# Patient Record
Sex: Female | Born: 1965 | Race: Black or African American | Hispanic: No | Marital: Single | Smoking: Never smoker
Health system: Southern US, Community
[De-identification: ages and names within clinical notes are randomized; demographics above are authoritative.]

## PROBLEM LIST (undated history)

## (undated) DIAGNOSIS — K219 Gastro-esophageal reflux disease without esophagitis: Secondary | ICD-10-CM

## (undated) DIAGNOSIS — L821 Other seborrheic keratosis: Secondary | ICD-10-CM

## (undated) DIAGNOSIS — D259 Leiomyoma of uterus, unspecified: Secondary | ICD-10-CM

## (undated) DIAGNOSIS — J321 Chronic frontal sinusitis: Secondary | ICD-10-CM

## (undated) DIAGNOSIS — G43009 Migraine without aura, not intractable, without status migrainosus: Secondary | ICD-10-CM

## (undated) DIAGNOSIS — M659 Synovitis and tenosynovitis, unspecified: Secondary | ICD-10-CM

## (undated) DIAGNOSIS — Z800 Family history of malignant neoplasm of digestive organs: Secondary | ICD-10-CM

## (undated) DIAGNOSIS — J209 Acute bronchitis, unspecified: Secondary | ICD-10-CM

## (undated) DIAGNOSIS — R42 Dizziness and giddiness: Secondary | ICD-10-CM

## (undated) DIAGNOSIS — G44229 Chronic tension-type headache, not intractable: Secondary | ICD-10-CM

## (undated) DIAGNOSIS — G4700 Insomnia, unspecified: Secondary | ICD-10-CM

## (undated) DIAGNOSIS — K5900 Constipation, unspecified: Secondary | ICD-10-CM

## (undated) DIAGNOSIS — I10 Essential (primary) hypertension: Secondary | ICD-10-CM

## (undated) DIAGNOSIS — M79673 Pain in unspecified foot: Principal | ICD-10-CM

## (undated) DIAGNOSIS — N952 Postmenopausal atrophic vaginitis: Secondary | ICD-10-CM

## (undated) DIAGNOSIS — Z1211 Encounter for screening for malignant neoplasm of colon: Secondary | ICD-10-CM

## (undated) HISTORY — DX: Leiomyoma of uterus, unspecified: D25.9

## (undated) HISTORY — DX: Migraine without aura, not intractable, without status migrainosus: G43.009

## (undated) HISTORY — DX: Chronic frontal sinusitis: J32.1

## (undated) HISTORY — DX: Dizziness and giddiness: R42

## (undated) HISTORY — DX: Hypercalcemia: E83.52

## (undated) HISTORY — DX: Acute bronchitis, unspecified: J20.9

## (undated) HISTORY — DX: Encounter for screening for malignant neoplasm of colon: Z12.11

## (undated) HISTORY — DX: Pain in unspecified foot: M79.673

## (undated) HISTORY — DX: Chronic tension-type headache, not intractable: G44.229

## (undated) HISTORY — DX: Insomnia, unspecified: G47.00

## (undated) HISTORY — DX: Other seborrheic keratosis: L82.1

## (undated) HISTORY — DX: Constipation, unspecified: K59.00

## (undated) HISTORY — PX: HEMORROIDECTOMY: SUR656

## (undated) HISTORY — DX: Family history of malignant neoplasm of digestive organs: Z80.0

## (undated) HISTORY — DX: Gastro-esophageal reflux disease without esophagitis: K21.9

## (undated) HISTORY — DX: Synovitis and tenosynovitis, unspecified: M65.9

## (undated) HISTORY — DX: Postmenopausal atrophic vaginitis: N95.2

---

## 1998-04-07 ENCOUNTER — Emergency Department (HOSPITAL_COMMUNITY): Admission: EM | Admit: 1998-04-07 | Discharge: 1998-04-07 | Payer: Self-pay | Admitting: Emergency Medicine

## 2006-09-11 DIAGNOSIS — J209 Acute bronchitis, unspecified: Secondary | ICD-10-CM

## 2006-09-11 DIAGNOSIS — J321 Chronic frontal sinusitis: Secondary | ICD-10-CM | POA: Insufficient documentation

## 2006-09-11 DIAGNOSIS — G43909 Migraine, unspecified, not intractable, without status migrainosus: Secondary | ICD-10-CM | POA: Insufficient documentation

## 2006-09-11 HISTORY — DX: Chronic frontal sinusitis: J32.1

## 2006-09-11 HISTORY — DX: Acute bronchitis, unspecified: J20.9

## 2007-05-01 DIAGNOSIS — K649 Unspecified hemorrhoids: Secondary | ICD-10-CM | POA: Insufficient documentation

## 2007-05-15 DIAGNOSIS — L821 Other seborrheic keratosis: Secondary | ICD-10-CM | POA: Insufficient documentation

## 2007-05-15 HISTORY — DX: Other seborrheic keratosis: L82.1

## 2008-06-17 DIAGNOSIS — K625 Hemorrhage of anus and rectum: Secondary | ICD-10-CM | POA: Insufficient documentation

## 2008-06-17 DIAGNOSIS — Z1211 Encounter for screening for malignant neoplasm of colon: Secondary | ICD-10-CM | POA: Insufficient documentation

## 2008-06-17 DIAGNOSIS — R109 Unspecified abdominal pain: Secondary | ICD-10-CM | POA: Insufficient documentation

## 2008-06-17 DIAGNOSIS — Z800 Family history of malignant neoplasm of digestive organs: Secondary | ICD-10-CM

## 2008-06-17 HISTORY — DX: Family history of malignant neoplasm of digestive organs: Z80.0

## 2008-06-17 HISTORY — DX: Encounter for screening for malignant neoplasm of colon: Z12.11

## 2010-01-04 ENCOUNTER — Emergency Department (HOSPITAL_COMMUNITY): Admission: EM | Admit: 2010-01-04 | Discharge: 2010-01-04 | Payer: Self-pay | Admitting: Emergency Medicine

## 2010-01-11 ENCOUNTER — Ambulatory Visit (HOSPITAL_COMMUNITY): Admission: RE | Admit: 2010-01-11 | Discharge: 2010-01-11 | Payer: Self-pay | Admitting: General Surgery

## 2010-09-03 HISTORY — PX: TOTAL ABDOMINAL HYSTERECTOMY W/ BILATERAL SALPINGOOPHORECTOMY: SHX83

## 2010-10-10 ENCOUNTER — Other Ambulatory Visit: Payer: Self-pay | Admitting: Obstetrics & Gynecology

## 2010-10-10 DIAGNOSIS — Z1231 Encounter for screening mammogram for malignant neoplasm of breast: Secondary | ICD-10-CM

## 2010-10-23 ENCOUNTER — Ambulatory Visit: Payer: Self-pay

## 2010-11-02 ENCOUNTER — Ambulatory Visit
Admission: RE | Admit: 2010-11-02 | Discharge: 2010-11-02 | Disposition: A | Discharge status: Home / Self Care | Payer: BC Managed Care – PPO | Source: Ambulatory Visit | Attending: Obstetrics & Gynecology | Admitting: Obstetrics & Gynecology

## 2010-11-02 DIAGNOSIS — Z1231 Encounter for screening mammogram for malignant neoplasm of breast: Secondary | ICD-10-CM

## 2010-11-06 ENCOUNTER — Ambulatory Visit: Payer: Self-pay

## 2010-11-28 ENCOUNTER — Other Ambulatory Visit: Payer: Self-pay | Admitting: Obstetrics & Gynecology

## 2010-11-28 DIAGNOSIS — M545 Low back pain, unspecified: Secondary | ICD-10-CM

## 2010-12-05 ENCOUNTER — Other Ambulatory Visit: Payer: BC Managed Care – PPO

## 2011-03-05 ENCOUNTER — Ambulatory Visit (HOSPITAL_COMMUNITY)
Admission: RE | Admit: 2011-03-05 | Discharge: 2011-03-05 | Disposition: A | Discharge status: Home / Self Care | Payer: BC Managed Care – PPO | Source: Ambulatory Visit | Attending: Obstetrics & Gynecology | Admitting: Obstetrics & Gynecology

## 2011-03-05 ENCOUNTER — Other Ambulatory Visit: Payer: Self-pay | Admitting: Obstetrics & Gynecology

## 2011-03-05 DIAGNOSIS — N803 Endometriosis of pelvic peritoneum, unspecified: Secondary | ICD-10-CM | POA: Insufficient documentation

## 2011-03-05 DIAGNOSIS — IMO0002 Reserved for concepts with insufficient information to code with codable children: Secondary | ICD-10-CM | POA: Insufficient documentation

## 2011-03-05 DIAGNOSIS — N946 Dysmenorrhea, unspecified: Secondary | ICD-10-CM | POA: Insufficient documentation

## 2011-03-05 DIAGNOSIS — K7689 Other specified diseases of liver: Secondary | ICD-10-CM | POA: Insufficient documentation

## 2011-03-05 LAB — CBC
MCV: 91 fL (ref 78.0–100.0)
Platelets: 165 10*3/uL (ref 150–400)
RDW: 13.4 % (ref 11.5–15.5)
WBC: 4.7 10*3/uL (ref 4.0–10.5)

## 2011-03-05 LAB — SURGICAL PCR SCREEN
MRSA, PCR: NEGATIVE
Staphylococcus aureus: NEGATIVE

## 2011-03-09 NOTE — Op Note (Signed)
Linda, Nelson               ACCOUNT NO.:  [id number]  MEDICAL RECORD NO.:  [id number]  LOCATION:  9319                          FACILITY:  [facility]  PHYSICIAN:  Genia Del, M.D.DATE OF BIRTH:  1965/11/22  DATE OF PROCEDURE:  03/05/2011 DATE OF DISCHARGE:  03/05/2011                              OPERATIVE REPORT   PREOPERATIVE DIAGNOSES:  Dysmenorrhea, dyspareunia, and uterine myoma.  POSTOPERATIVE DIAGNOSES:  Dysmenorrhea, dyspareunia, and uterine myoma plus mild pelvic endometriosis and possible fatty liver.  PROCEDURE:  Total laparoscopic hysterectomy plus bilateral salpingo- oophorectomy assisted with da Vinci robot and cauterization of endometriosis.  SURGEON:  Genia Del, MD.  ASSISTANTDarryl Nestle, MD.  DESCRIPTION OF PROCEDURE:  Under general anesthesia with endotracheal intubation, the patient is in the lithotomy position.  She is prepped with Betadine on the abdominal, suprapubic, vulvar, and vaginal areas and draped as usual.  The Foley is put in place in the bladder.  We did a vaginal exam revealing an anteverted uterus, normal volume, mobile, no adnexal mass.  We inserted the weighted speculum in the vagina, grasped the anterior lip of the cervix with a tenaculum.  The hysterometer is at 9 cm, a #8 coring is requested.  We dilated the cervix with Hegar dilators up to #25 without difficulty.  The RUMI with a medium coring is put in place, the other instruments are removed.  We go to the abdomen, make a suprapubic incision with the scalpel over 1.5 cm after infiltrating with Marcaine one-quarter plain.  We opened the aponeurosis under direct vision with Mayo scissors.  We do a pursestring stitch with Vicryl zero in the aponeurosis.  The parietal peritoneum was opened bluntly with a finger.  We inserted the Hasson at that level and created pneumoperitoneum with CO2.  We inspected the abdominopelvic cavities. The uterus is slightly irregular with  a small fundal myoma.  The ovaries are normal in size.  The left ovary has superficial lesions of endometriosis.  There are also lesions of endometriosis close to the left uterosacral ligament.  We also note a fatty liver appearance. Pictures were taken of the liver and also of the pelvic organs.  We then removed the camera.  We marked the skin with a surgical pen and used an M configuration for the port placement.  We will put two robotic ports on the right and one assistant port on the upper left and the third robotic port on the lower left.  The ports are all inserted under direct vision after infiltrating with Marcaine one-quarter plain and making incisions with a scalpel.  We then put the patient in deep Trendelenburg.  We dock the robot without difficulty on the right side. We then inserted the instruments, the EndoShear scissor in the first arm, the PK in the second arm, and the Cobra in the third arm.  We go to the console.  We visualized the ureters which are in normal anatomic position, although on the left side it is more difficult to see because of some CO2 under the peritoneum.  We cauterized and sectioned the left round ligament, the left infundibulopelvic ligament, and approached close the uterus, and stop just before  the left uterine artery.  We opened the visceral peritoneum anteriorly and start descending the bladder.  The patient had two previous C-sections, but the adhesions are not severe at that level.  We did exactly the same on the right side and descend the bladder further past the coring.  We then cauterize and sectioned the left uterine artery and the right uterine artery.  We then opened the colpotomy with the tip of the EndoShear scissors, starting anteriorly, then posteriorly, and finishing on each side.  The uterus is completely detached with both ovaries and tubes.  It is removed vaginally and sent to pathology.  We then put the occluder in the vagina to  preserve the pneumoperitoneum.  We cauterized the superficial lesions of endometriosis on the left uterosacral ligament.  We then verify hemostasis on the vaginal vault and completed with the PK.  We switched instruments to the cutting needle driver on the second arm, the regular needle driver on the first arm, and the peak in the third arm.  We then inserted five sutures of Vicryl 0 through the camera port and then parked them on the right lateral abdominal wall.  We closed the vaginal vault with figure-of-eights.  We start on the left angle, then the right angle, and three additional figure-of-eights to finish closing in the middle, that closes the vaginal vault very well, on each bite, the vaginal mucosa was included.  Hemostasis was adequate at all levels. Irrigation and suction was done.  We therefore removed all instruments. We undocked the robot.  We then went by laparoscopy and removed the five needles using a 5-mm camera.  We then irrigated and suctioned once more, removed the patient from deep Trendelenburg to remove as much fluid as possible.  Hemostasis was adequate.  The estimated blood loss was 75 mL. We removed all ports under direct vision, evacuated the CO2.  We then closed all incisions with a Vicryl 4-0 subcuticular stitch.  We attached the pursestring stitch at the supraumbilical incision and we added Dermabond on all incisions.  The occluder was removed from the vagina. No complications occurred.  The count of instruments and sponges was complete.  The patient was brought to recovery room in good stable status.     Genia Del, M.D.     ML/MEDQ  D:  03/05/2011  T:  03/06/2011  Job:  [id number]  Electronically Signed by Genia Del M.D. on 03/09/2011 09:32:36 PM

## 2011-03-14 LAB — TYPE AND SCREEN: ABO/RH(D): B POS

## 2011-03-28 ENCOUNTER — Ambulatory Visit: Payer: BC Managed Care – PPO | Admitting: Gastroenterology

## 2011-03-28 ENCOUNTER — Encounter (HOSPITAL_COMMUNITY): Payer: Self-pay | Admitting: Anesthesiology

## 2011-03-28 ENCOUNTER — Inpatient Hospital Stay (HOSPITAL_COMMUNITY): Payer: BC Managed Care – PPO | Admitting: Anesthesiology

## 2011-03-28 ENCOUNTER — Ambulatory Visit: Admit: 2011-03-28 | Payer: Self-pay | Admitting: Obstetrics & Gynecology

## 2011-03-28 ENCOUNTER — Observation Stay (HOSPITAL_COMMUNITY)
Admission: AD | Admit: 2011-03-28 | Discharge: 2011-03-29 | Disposition: A | Discharge status: Home / Self Care | Payer: BC Managed Care – PPO | Source: Ambulatory Visit | Attending: Obstetrics & Gynecology | Admitting: Obstetrics & Gynecology

## 2011-03-28 ENCOUNTER — Encounter (HOSPITAL_COMMUNITY)
Admission: AD | Disposition: A | Discharge status: Home / Self Care | Payer: Self-pay | Source: Ambulatory Visit | Attending: Obstetrics & Gynecology

## 2011-03-28 DIAGNOSIS — Y838 Other surgical procedures as the cause of abnormal reaction of the patient, or of later complication, without mention of misadventure at the time of the procedure: Secondary | ICD-10-CM | POA: Insufficient documentation

## 2011-03-28 DIAGNOSIS — IMO0002 Reserved for concepts with insufficient information to code with codable children: Principal | ICD-10-CM | POA: Insufficient documentation

## 2011-03-28 HISTORY — PX: VAGINAL PROLAPSE REPAIR: SHX830

## 2011-03-28 LAB — TYPE AND SCREEN

## 2011-03-28 SURGERY — SUSPENSION, VAGINAL VAULT
Anesthesia: General | Wound class: Clean Contaminated

## 2011-03-28 MED ORDER — ONDANSETRON HCL 4 MG/2ML IJ SOLN
INTRAMUSCULAR | Status: AC
  Administered 2011-03-28: 4 mg via INTRAVENOUS
  Filled 2011-03-28: qty 2

## 2011-03-28 MED ORDER — FAMOTIDINE IN NACL 20-0.9 MG/50ML-% IV SOLN
20.0000 mg | Freq: Once | INTRAVENOUS | Status: AC
  Administered 2011-03-28: 20 mg via INTRAVENOUS
  Filled 2011-03-28: qty 50

## 2011-03-28 MED ORDER — NALBUPHINE SYRINGE 5 MG/0.5 ML
5.0000 mg | INJECTION | INTRAMUSCULAR | Status: DC | PRN
  Administered 2011-03-28 – 2011-03-29 (×3): 5 mg via SUBCUTANEOUS
  Filled 2011-03-28: qty 1

## 2011-03-28 MED ORDER — ONDANSETRON HCL 4 MG/2ML IJ SOLN
INTRAMUSCULAR | Status: DC | PRN
  Administered 2011-03-28: 4 mg via INTRAVENOUS

## 2011-03-28 MED ORDER — DEXAMETHASONE SODIUM PHOSPHATE 10 MG/ML IJ SOLN
INTRAMUSCULAR | Status: AC
  Filled 2011-03-28: qty 1

## 2011-03-28 MED ORDER — IBUPROFEN 600 MG PO TABS: 600.0000 mg | ORAL_TABLET | Freq: Four times a day (QID) | ORAL | Status: DC | PRN

## 2011-03-28 MED ORDER — DEXAMETHASONE SODIUM PHOSPHATE 10 MG/ML IJ SOLN
INTRAMUSCULAR | Status: DC | PRN
  Administered 2011-03-28: 10 mg via INTRAVENOUS

## 2011-03-28 MED ORDER — ACETAMINOPHEN 325 MG PO TABS: 325.0000 mg | ORAL_TABLET | ORAL | Status: DC | PRN

## 2011-03-28 MED ORDER — FENTANYL CITRATE 0.05 MG/ML IJ SOLN
INTRAMUSCULAR | Status: DC | PRN
  Administered 2011-03-28 (×2): 50 ug via INTRAVENOUS

## 2011-03-28 MED ORDER — MORPHINE SULFATE 0.5 MG/ML IJ SOLN
INTRAMUSCULAR | Status: AC
  Filled 2011-03-28: qty 10

## 2011-03-28 MED ORDER — OXYCODONE-ACETAMINOPHEN 5-325 MG PO TABS: 1.0000 | ORAL_TABLET | ORAL | Status: DC | PRN

## 2011-03-28 MED ORDER — FENTANYL CITRATE 0.05 MG/ML IJ SOLN
INTRAMUSCULAR | Status: AC
  Filled 2011-03-28: qty 2

## 2011-03-28 MED ORDER — FENTANYL CITRATE 0.05 MG/ML IJ SOLN: 25.0000 ug | INTRAMUSCULAR | Status: DC | PRN

## 2011-03-28 MED ORDER — NALOXONE HCL 0.4 MG/ML IJ SOLN: 0.4000 mg | INTRAMUSCULAR | Status: DC | PRN

## 2011-03-28 MED ORDER — KETOROLAC TROMETHAMINE 30 MG/ML IJ SOLN
INTRAMUSCULAR | Status: AC
  Administered 2011-03-28: 30 mg via INTRAVENOUS
  Filled 2011-03-28: qty 1

## 2011-03-28 MED ORDER — MIDAZOLAM HCL 2 MG/2ML IJ SOLN
INTRAMUSCULAR | Status: AC
  Filled 2011-03-28: qty 2

## 2011-03-28 MED ORDER — SODIUM CHLORIDE 0.9 % IJ SOLN: 3.0000 mL | INTRAMUSCULAR | Status: DC | PRN

## 2011-03-28 MED ORDER — KETOROLAC TROMETHAMINE 30 MG/ML IJ SOLN
30.0000 mg | Freq: Once | INTRAMUSCULAR | Status: AC
  Administered 2011-03-28: 30 mg via INTRAVENOUS

## 2011-03-28 MED ORDER — NALBUPHINE SYRINGE 5 MG/0.5 ML
5.0000 mg | INJECTION | INTRAMUSCULAR | Status: DC | PRN
  Filled 2011-03-28: qty 1

## 2011-03-28 MED ORDER — SODIUM CHLORIDE 0.9 % IV SOLN: 1.0000 ug/kg/h | INTRAVENOUS | Status: DC | PRN

## 2011-03-28 MED ORDER — HYDROMORPHONE HCL 1 MG/ML IJ SOLN: 0.2000 mg | INTRAMUSCULAR | Status: DC | PRN

## 2011-03-28 MED ORDER — MIDAZOLAM HCL 5 MG/5ML IJ SOLN
INTRAMUSCULAR | Status: DC | PRN
  Administered 2011-03-28: 2 mg via INTRAVENOUS

## 2011-03-28 MED ORDER — ONDANSETRON HCL 4 MG/2ML IJ SOLN
4.0000 mg | Freq: Once | INTRAMUSCULAR | Status: AC | PRN
  Administered 2011-03-28: 4 mg via INTRAVENOUS

## 2011-03-28 MED ORDER — CEFAZOLIN SODIUM 1-5 GM-% IV SOLN
INTRAVENOUS | Status: DC | PRN
  Administered 2011-03-28: 1 g via INTRAVENOUS

## 2011-03-28 MED ORDER — KETOROLAC TROMETHAMINE 30 MG/ML IJ SOLN
30.0000 mg | Freq: Four times a day (QID) | INTRAMUSCULAR | Status: DC | PRN
  Administered 2011-03-29: 30 mg via INTRAVENOUS
  Filled 2011-03-28: qty 1

## 2011-03-28 MED ORDER — MEPERIDINE HCL 25 MG/ML IJ SOLN: 6.2500 mg | INTRAMUSCULAR | Status: DC | PRN

## 2011-03-28 MED ORDER — CEFAZOLIN SODIUM 1-5 GM-% IV SOLN
INTRAVENOUS | Status: AC
  Filled 2011-03-28: qty 50

## 2011-03-28 MED ORDER — KETOROLAC TROMETHAMINE 60 MG/2ML IM SOLN: 60.0000 mg | Freq: Once | INTRAMUSCULAR | Status: AC | PRN

## 2011-03-28 MED ORDER — KETOROLAC TROMETHAMINE 30 MG/ML IJ SOLN: 30.0000 mg | Freq: Four times a day (QID) | INTRAMUSCULAR | Status: DC | PRN

## 2011-03-28 MED ORDER — ONDANSETRON HCL 4 MG/2ML IJ SOLN
INTRAMUSCULAR | Status: AC
  Filled 2011-03-28: qty 2

## 2011-03-28 MED ORDER — LACTATED RINGERS IV SOLN
INTRAVENOUS | Status: DC | PRN
  Administered 2011-03-28 (×2): via INTRAVENOUS

## 2011-03-28 MED ORDER — LACTATED RINGERS IV SOLN
INTRAVENOUS | Status: DC
  Administered 2011-03-28: 1000 mL via INTRAVENOUS
  Administered 2011-03-29: 05:00:00 via INTRAVENOUS

## 2011-03-28 NOTE — Progress Notes (Signed)
Pt sent from the office for AOS to repair vagina cuff. Had a hysterectomy on 7-2. Started bleeding heavy today at 1200 with clots. Has has some bleeding before today

## 2011-03-28 NOTE — Transfer of Care (Signed)
Immediate Anesthesia Transfer of Care Note  Patient: Linda Nelson  Procedure(s) Performed:  VAGINAL VAULT SUSPENSION - Repair of Vaginal Cuff  Patient Location: PACU  Anesthesia Type: Spinal  Level of Consciousness: awake, alert  and oriented  Airway & Oxygen Therapy: Patient Spontanous Breathing  Post-op Assessment: Report given to PACU RN  Post vital signs: stable  Complications: No apparent anesthesia complications

## 2011-03-28 NOTE — H&P (Signed)
History  ELGA SANTY 45 y.o.      Chief Complaint  Patient presents with  . Vaginal Bleeding       Postop TLH robotic about 2 wks ago.  Past Medical History  Diagnosis Date  . Uterine fibroid     PSH:  TLH robotic (fatty liver? Op finding, pending GI consult)            Hemorrhoidectomy  Family History  Problem Relation Age of Onset  . Hypertension    . Prostate cancer    . Uterine cancer      History  Substance Use Topics  . Smoking status: Not on file  . Smokeless tobacco: Not on file  . Alcohol Use: Not on file    Allergies: No Known Allergies  Prescriptions prior to admission  Medication Sig Dispense Refill  . naproxen sodium (ANAPROX) 220 MG tablet Take 220 mg by mouth 2 (two) times daily with a meal.        . oxyCODONE-acetaminophen (TYLOX) 5-500 MG per capsule Take 1 capsule by mouth every 4 (four) hours as needed.        . norethindrone (CAMILA) 0.35 MG tablet Take 1 tablet by mouth daily.          Physical Exam   Blood pressure 109/74, pulse 110, temperature 98.9 F (37.2 C), temperature source Oral, resp. rate 16, height 5' 3.5" (1.613 m), weight 70.761 kg (156 lb), SpO2 98.00%.  Speculum exam:  abondant red blood and clots removed from vagina.  No dehiscence, but loosened stitches with bleeder from Rt angle.  CBC in office today Hgb 13, Plt 273. CMP requested, not done.  ED Course  Hemodynamically stable. Prepared for surgery.   A/P  Post op TLH vaginal hemorrhage from vaginal vault.  OR for vaginal vault repair/controle of hemorrhage, possible laparotomy.  Informed consent signed. T/S, CMP  Genia Del MD

## 2011-03-28 NOTE — Anesthesia Preprocedure Evaluation (Addendum)
Anesthesia Evaluation  Name, MR# and DOB Patient awake  General Assessment Comment  Reviewed: Allergy & Precautions, H&P  and Patient's Chart, lab work & pertinent test results  Airway Mallampati: II TM Distance: >3 FB Neck ROM: full    Dental No notable dental hx    Pulmonary  clear to auscultation  pulmonary exam normal   Cardiovascular Exercise Tolerance: Good regular Normal   Neuro/Psych  GI/Hepatic/Renal   Endo/Other   Abdominal   Musculoskeletal  Hematology   Peds  Reproductive/Obstetrics   Anesthesia Other Findings             Anesthesia Physical Anesthesia Plan  ASA: II  Anesthesia Plan: Spinal   Post-op Pain Management:    Induction:   Airway Management Planned:   Additional Equipment:   Intra-op Plan:   Post-operative Plan:   Informed Consent: I have reviewed the patients History and Physical, chart, labs and discussed the procedure including the risks, benefits and alternatives for the proposed anesthesia with the patient or authorized representative who has indicated his/her understanding and acceptance.   Dental Advisory Given  Plan Discussed with: CRNA  Anesthesia Plan Comments: (Lab work confirmed with CRNA in room. Platelets okay. Discussed spinal anesthetic, and patient consents to the procedure:  included risk of possible headache,backache, failed block, allergic reaction, and nerve injury. This patient was asked if she had any questions or concerns before the procedure started. )        Anesthesia Quick Evaluation

## 2011-03-28 NOTE — Anesthesia Procedure Notes (Addendum)
Spinal Block  Patient location during procedure: OR Start time: 03/28/2011 5:41 PM Staffing Anesthesiologist: Jiles Garter Preanesthetic Checklist Completed: patient identified, site marked, surgical consent, pre-op evaluation, timeout performed, IV checked, risks and benefits discussed and monitors and equipment checked Spinal Block Patient position: sitting Prep: DuraPrep Patient monitoring: heart rate, cardiac monitor, continuous pulse ox and blood pressure Approach: midline Location: L3-4 Injection technique: single-shot Needle Needle type: Sprotte  Needle gauge: 24 G Needle length: 9 cm Assessment Sensory level: T8 Additional Notes Spinal Dosage in OR  Bupivicaine ml       1.4 PFMS04   mcg        150

## 2011-03-28 NOTE — Op Note (Signed)
03/28/2011  6:22 PM  PATIENT:  Linda Nelson  45 y.o. female  PRE-OPERATIVE DIAGNOSIS:  Post-Operative Vaginal Cuff Hemorrhage  POST-OPERATIVE DIAGNOSIS:  Post-Operative Vaginal Cuff Hemorrhage  PROCEDURE:  Procedure(s): VAGINAL VAULT REPAIR VAGINALLY  SURGEON:  Surgeon(s): [name] Demontrae Gilbert  ASSISTANTS: none  ANESTHESIA:   spinal  Procedure:  Under spinal anesthesia the patient is in lithotomy position. She is prepped with Betadine on the suprapubic vulvar and vaginal area. And draped as usual. A dose of Ancef 1 g IV is given. Vaginal blood clots were evacuated during the prep, corresponding to about 50 cc. The bladder is catheterized. A weighted speculum was introduced in the vagina. And a retractor is used anteriorly. Bleeding is coming from the left angle of the vaginal vault mainly. A small amount of bleeding is also coming from the right ankle. There is no vaginal vault dehiscence, but the sutures appear slightly loose. Vicryl 0 on a CT to is used to control bleeding at the left angle with a figure of 8. We then put a figure-of-eight with Vicryl 0 at the right angle.  And finish reinforcing the vaginal vault with 4 additional figure-of-eight all along in between the 2 angles. That controls the vaginal bleeding completely. Hemostasis is adequate at all levels. A Foley catheter is inserted in the bladder. And the patient is transferred to PACU in good stable status.  ESTIMATED BLOOD LOSS:  30 cc   Intake/Output Summary (Last 24 hours) at 03/28/11 1822 Last data filed at 03/28/11 1813  Gross per 24 hour  Intake   1500 ml  Output    130 ml  Net   1370 ml     BLOOD ADMINISTERED:none   LOCAL MEDICATIONS USED:  NONE  SPECIMEN:  No Specimen  DISPOSITION OF SPECIMEN:  N/A  COUNTS:  YES   PLAN OF CARE: Transfer to PACU    Genia Del MD

## 2011-03-28 NOTE — Anesthesia Postprocedure Evaluation (Signed)
  Anesthesia Post-op Note  Patient: Linda Nelson  Procedure(s) Performed:  VAGINAL VAULT SUSPENSION - Repair of Vaginal Cuff   Patient is awake, responsive, moving her legs, and has signs of resolution of her numbness. Pain and nausea are reasonably well controlled. Vital signs are stable and clinically acceptable. Oxygen saturation is clinically acceptable. There are no apparent anesthetic complications at this time. Patient is ready for discharge.

## 2011-03-29 ENCOUNTER — Inpatient Hospital Stay (HOSPITAL_COMMUNITY): Payer: BC Managed Care – PPO

## 2011-03-29 LAB — CBC
MCH: 29.9 pg (ref 26.0–34.0)
MCHC: 33.6 g/dL (ref 30.0–36.0)
MCV: 89.1 fL (ref 78.0–100.0)
Platelets: 184 10*3/uL (ref 150–400)
RDW: 12.9 % (ref 11.5–15.5)

## 2011-03-29 MED ORDER — PROMETHAZINE HCL 25 MG/ML IJ SOLN
12.5000 mg | Freq: Four times a day (QID) | INTRAMUSCULAR | Status: DC | PRN
  Administered 2011-03-29: 12.5 mg via INTRAVENOUS
  Filled 2011-03-29: qty 1

## 2011-03-29 NOTE — Progress Notes (Signed)
03/29/11  1:34 PO#1 S:   Feels well/tolerating po/no N/V/ none/Ambulating/Voiding/Flatus +/ minimal vag. spotting  ROS neg.   O: A&O x 3/ Filed Vitals:   03/29/11 1003  BP: 111/72  Pulse: 65  Temp: 98.7 F (37.1 C)  Resp: 18     Labs: Lab Results  Component Value Date   WBC 7.1 03/29/2011   HGB 10.2* 03/29/2011   HCT 30.4* 03/29/2011   MCV 89.1 03/29/2011   PLT 184 03/29/2011      Intake/Output Summary (Last 24 hours) at 03/29/11 1333 Last data filed at 03/29/11 1200  Gross per 24 hour  Intake   2480 ml  Output   2305 ml  Net    175 ml     Lab Results  Component Value Date   WBC 7.1 03/29/2011   HGB 10.2* 03/29/2011   HCT 30.4* 03/29/2011   MCV 89.1 03/29/2011   PLT 184 03/29/2011    Lungs: clear  Heart: rcr  Abdomen: soft/Bowel sounds: present/Insicion: intact  Vulva normal, no vag. bleeding  Extremities: normal, no oedema           no calf pain/tenderness   Pelvic US no abcess or hematoma in pelvis                  A/P: POD#1  Good post op, stable status.  Recommendations given.  F/U 1 wk.  D/C home.   Genia Del MD

## 2011-04-09 ENCOUNTER — Encounter (INDEPENDENT_AMBULATORY_CARE_PROVIDER_SITE_OTHER): Payer: Self-pay | Admitting: General Surgery

## 2011-04-09 ENCOUNTER — Ambulatory Visit (INDEPENDENT_AMBULATORY_CARE_PROVIDER_SITE_OTHER): Payer: BC Managed Care – PPO | Admitting: General Surgery

## 2011-04-09 VITALS — BP 122/82 | HR 60 | Temp 96.5°F | Ht 66.0 in | Wt 158.1 lb

## 2011-04-09 DIAGNOSIS — K648 Other hemorrhoids: Secondary | ICD-10-CM

## 2011-04-09 MED ORDER — HYDROCORTISONE ACETATE 25 MG RE SUPP: 25.0000 mg | Freq: Two times a day (BID) | RECTAL | Status: AC

## 2011-04-09 NOTE — Progress Notes (Signed)
Subjective:     Patient ID: Linda Nelson, female   DOB: 1966-04-05, 45 y.o.   MRN: [id number]  HPI The patient has had 2 recent surgeries she had a robotic hysterectomy by [facility] on July 2 and [address] [facility] and then required repeat surgery approximately 3 weeks later for bleeding from the vaginal cuff. The vaginal cuff surgery was an emergency and I did her with a spinal and she says she got extremely constipated after the surgery and then when she did have a bowel movement she started having problems with bleeding from her rectum.  The patient has been operated on previously by Dr. Donell Beers and may 2011 at which time she did excision of 3 large external hemorrhoids and rubberbanded to internal hemorrhoids. Patient says that she's not had father problems with her hemorrhoids until this GYN surgery and she was last seen by Dr. Donell Beers and November 11 at which time Dr. Donell Beers said that she thought she may have internal hemorrhoids persist in the just recommended the suppositories. Dr. Kathi Simpers name was admitted and mentioned previously and the patient says she never saw Dr. Bosie Clos and her last colonoscopy was approximately 5 years ago when she lives in [place].  Review of SystemsHer review of systems does not show any problems with active bleeding and none family history of actually colon cancer that she is aware of     Objective:   Physical Exam Her physical exam Limited predominantly to the anus there is no evidence of any external hemorrhoids and when she strains after doing on anoscopic exam areas internal hemorrhoids that will prolapse a little bit on anoscopic exam she's got 3 large internal hemorrhoids that have been recently irritated but they are not actively bleeding at this time. I did not do a vaginal exam Autophor she's had the closure for vaginal cuff 2 weeks earlier but course the vaginal cuff and the hemorrhoid area is close to one another.    Assessment:    I would recommend that  is still using a cortisone lidocaine cream with a little applicator that we use hemorrhoidal suppositories and use as if she is having bleeding or pain but a use lidocaine ointment if she is actually had an anal discomfort which at present she's not having I would not recommend trying to do rubber bands at this time and think that she can avoid the constipation hopefully the internal hemorrhoids will decrease on their on if she is having significant bleeding with a large internal hemorrhoids probably excision may be needed a rubber banding could be considered but I would wait on doing anything until she is a little father along messes active hemorrhage     Plan:    She's got a prescription for the preparation H. with the steroid suppositories b.i.d. and I will give her also some lidocaine ointment which she can use if she is actually having anal pain she says she's had a fissure right after her surgery sounds like that she got real constipated but I find no evidence of any active fissure or chronic fissure at this time  I will see her in 2 weeks and she plans on returning to work in approximately 10 days

## 2011-04-09 NOTE — Patient Instructions (Signed)
Patient will continue is a stool softeners and use MiraLax if needed for constipation. She has a femoral suppositories with 3 refills and also the lidocaine ointment if she is having anal pain she will return sooner if she is having significant hemorrhage but I think this will probably decrease and maybe not require any additional anal surgery

## 2011-04-17 ENCOUNTER — Encounter (INDEPENDENT_AMBULATORY_CARE_PROVIDER_SITE_OTHER): Payer: Self-pay | Admitting: General Surgery

## 2011-04-18 ENCOUNTER — Encounter (INDEPENDENT_AMBULATORY_CARE_PROVIDER_SITE_OTHER): Payer: Self-pay | Admitting: General Surgery

## 2011-04-18 ENCOUNTER — Encounter: Payer: Self-pay | Admitting: Gastroenterology

## 2011-04-18 ENCOUNTER — Ambulatory Visit (INDEPENDENT_AMBULATORY_CARE_PROVIDER_SITE_OTHER): Payer: BC Managed Care – PPO | Admitting: General Surgery

## 2011-04-18 VITALS — BP 118/76 | HR 70 | Temp 96.2°F

## 2011-04-18 DIAGNOSIS — K645 Perianal venous thrombosis: Secondary | ICD-10-CM

## 2011-04-18 NOTE — Patient Instructions (Signed)
Continue the hemorrhoidal creams t.i.d. and p.r.n. for now plus tub soaks bid. May restart the hemorrhoidal suppositories in 3 or 4 days if needed. Used the lidocaine 5% ointment p.r.n. basis for pain

## 2011-04-18 NOTE — Progress Notes (Signed)
Subjective:     Patient ID: Linda Nelson, female   DOB: 1966-02-23, 45 y.o.   MRN: [id number]  HPIPatient returns now proximally 10 days following her last visit and says she still having problems with her hemorrhoids and now has a thrombosed external hemorrhoid. She's had another episode of minor bleeding from her vagina and saw her gynecologist but as it showed that return to work in approximately 2 weeks   Review of Systems     Objective:   Physical ExamPatient has a large external hemorrhoids account as in the anal canal on the left side anteriorly I recommended that we removed was local and sedation she was in agreement about 4 cc 1% Xylocaine was used I did put a suture of figure-of-eight of 3-0 chromic at the apex and removed the large hemorrhoid help better immediately afterwards.     Assessment:    Thrombosed external hemorrhoids resolve and internal hemorrhoids which were disease and hemorrhoidal suppository type was she'll feel much better since the large thrombus was excised and not have any problems with immediate postoperative bleeding    Plan:     Return to see [place] in approximately 4 weeks

## 2011-04-19 ENCOUNTER — Encounter (HOSPITAL_COMMUNITY): Payer: Self-pay | Admitting: Obstetrics & Gynecology

## 2011-04-27 ENCOUNTER — Ambulatory Visit: Payer: BC Managed Care – PPO | Admitting: Gastroenterology

## 2011-05-16 ENCOUNTER — Ambulatory Visit: Payer: BC Managed Care – PPO | Admitting: Gastroenterology

## 2011-06-06 NOTE — Discharge Summary (Signed)
Physician Discharge Summary  Patient ID: Linda Nelson MRN: [id number] DOB/AGE: 45-Aug-1967 45 y.o.  Admit date: 03/28/2011 Discharge date: 06/06/2011  Admission Diagnoses: BLEEDING post of vagina curr hemorrhage  Discharge Diagnoses: BLEEDING post of vagina curr hemorrhage        Active Problems:  * No active hospital problems. *    Discharged Condition: good  Hospital Course:   Consults: none  Treatments: surgery  Disposition: Home or Self Care  Discharge Orders    Future Appointments: Provider: Department: Dept Phone: Center:   06/18/2011 3:00 PM Rob Bunting, MD [facility] Office [phone] [facility]     Discharge Medication List as of 03/29/2011  1:59 PM    CONTINUE these medications which have NOT CHANGED   Details  oxyCODONE-acetaminophen (TYLOX) 5-500 MG per capsule Take 1 capsule by mouth every 4 (four) hours as needed.  , Until Discontinued, Historical Med      STOP taking these medications     naproxen sodium (ANAPROX) 220 MG tablet      norethindrone (CAMILA) 0.35 MG tablet        Follow-up Information    Follow up with [name],[name] in 1 week.   Contact information:   [address] [place] [address] [phone]          Signed: Genia Del 06/06/2011, 1:33 PM

## 2011-06-18 ENCOUNTER — Ambulatory Visit: Payer: BC Managed Care – PPO | Admitting: Gastroenterology

## 2011-07-10 ENCOUNTER — Ambulatory Visit: Payer: BC Managed Care – PPO | Admitting: Gastroenterology

## 2011-09-26 ENCOUNTER — Encounter (INDEPENDENT_AMBULATORY_CARE_PROVIDER_SITE_OTHER): Payer: Self-pay | Admitting: General Surgery

## 2011-10-03 ENCOUNTER — Encounter: Payer: Self-pay | Admitting: Gastroenterology

## 2011-10-15 ENCOUNTER — Ambulatory Visit: Payer: BC Managed Care – PPO | Admitting: Gastroenterology

## 2011-10-15 ENCOUNTER — Telehealth: Payer: Self-pay | Admitting: Gastroenterology

## 2011-10-15 NOTE — Telephone Encounter (Signed)
Do not bill 

## 2011-11-06 ENCOUNTER — Ambulatory Visit: Payer: BC Managed Care – PPO | Admitting: Gastroenterology

## 2011-11-07 ENCOUNTER — Telehealth: Payer: Self-pay | Admitting: Gastroenterology

## 2011-11-07 NOTE — Telephone Encounter (Signed)
Message copied by Arna Snipe on Wed Nov 07, 2011  9:50 AM ------      Message from: Donata Duff      Created: Tue Nov 06, 2011  3:27 PM       Do not bill

## 2012-09-20 ENCOUNTER — Encounter (HOSPITAL_COMMUNITY): Payer: Self-pay

## 2012-09-20 ENCOUNTER — Emergency Department (HOSPITAL_COMMUNITY)
Admission: EM | Admit: 2012-09-20 | Discharge: 2012-09-21 | Disposition: A | Discharge status: Home / Self Care | Payer: BC Managed Care – PPO | Attending: Emergency Medicine | Admitting: Emergency Medicine

## 2012-09-20 ENCOUNTER — Emergency Department (HOSPITAL_COMMUNITY): Payer: BC Managed Care – PPO

## 2012-09-20 DIAGNOSIS — R079 Chest pain, unspecified: Secondary | ICD-10-CM

## 2012-09-20 DIAGNOSIS — Z7982 Long term (current) use of aspirin: Secondary | ICD-10-CM | POA: Insufficient documentation

## 2012-09-20 DIAGNOSIS — M791 Myalgia, unspecified site: Secondary | ICD-10-CM

## 2012-09-20 DIAGNOSIS — Z8679 Personal history of other diseases of the circulatory system: Secondary | ICD-10-CM | POA: Insufficient documentation

## 2012-09-20 DIAGNOSIS — Z8742 Personal history of other diseases of the female genital tract: Secondary | ICD-10-CM | POA: Insufficient documentation

## 2012-09-20 DIAGNOSIS — Z79899 Other long term (current) drug therapy: Secondary | ICD-10-CM | POA: Insufficient documentation

## 2012-09-20 DIAGNOSIS — IMO0001 Reserved for inherently not codable concepts without codable children: Secondary | ICD-10-CM | POA: Insufficient documentation

## 2012-09-20 LAB — HEPATIC FUNCTION PANEL
ALT: 16 U/L (ref 0–35)
Alkaline Phosphatase: 155 U/L — ABNORMAL HIGH (ref 39–117)
Bilirubin, Direct: <0.1 mg/dL (ref 0.0–0.3)
Total Bilirubin: 0.2 mg/dL — ABNORMAL LOW (ref 0.3–1.2)
Total Protein: 7.3 g/dL (ref 6.0–8.3)

## 2012-09-20 LAB — BASIC METABOLIC PANEL
BUN: 22 mg/dL (ref 6–23)
Calcium: 9.8 mg/dL (ref 8.4–10.5)
Creatinine, Ser: 0.79 mg/dL (ref 0.50–1.10)
GFR calc Af Amer: >90 mL/min (ref >90)
GFR calc non Af Amer: >90 mL/min (ref >90)
Potassium: 3.5 mEq/L (ref 3.5–5.1)

## 2012-09-20 LAB — CBC
HCT: 37.1 % (ref 36.0–46.0)
MCH: 30.2 pg (ref 26.0–34.0)
MCHC: 34.5 g/dL (ref 30.0–36.0)
RDW: 13.3 % (ref 11.5–15.5)

## 2012-09-20 LAB — URINALYSIS, ROUTINE W REFLEX MICROSCOPIC
Bilirubin Urine: NEGATIVE
Ketones, ur: NEGATIVE mg/dL
Nitrite: NEGATIVE
Protein, ur: NEGATIVE mg/dL

## 2012-09-20 MED ORDER — ONDANSETRON 4 MG PO TBDP
8.0000 mg | ORAL_TABLET | Freq: Once | ORAL | Status: AC
  Administered 2012-09-20: 8 mg via ORAL
  Filled 2012-09-20: qty 2

## 2012-09-20 NOTE — ED Notes (Addendum)
Dr. Zachery Dauer. Saw dr. Because of cp and muscle pain. Called today by pcp because of elevated cardiac enzymes. Can feel her heart skipping. Had a bad h/a yesterday but bp. Normal.

## 2012-09-20 NOTE — ED Notes (Signed)
Pt continues to deny CP. Reports increase in nausea. Ambulated to restroom with no difficulty. VSS. Family at bedside. Explained the delay in seeing a provider and apologized.

## 2012-09-20 NOTE — ED Provider Notes (Signed)
History     CSN: [id number]  Arrival date & time 09/20/12  2002   First MD Initiated Contact with Patient 09/20/12 2306      Chief Complaint  Patient presents with  . Chest Pain    (Consider location/radiation/quality/duration/timing/severity/associated sxs/prior treatment) HPI Comments: 47 year old female with no past medical history including no hypertension diabetes hypercholesterolemia and a nonsmoker who presents with a complaint of intermittent chest pain and muscle pain. She states she has had this for several days, it does not occur while she is at work where she works in [employer] area, working hard.  She feels it more when she comes home from work and at the end of the evening. This is sharp and stabbing, radiates from the chest to the back, goes away while she is at work and is not associated with fevers, cough, swelling of the lower extremities. She denies sore throat, nasal congestion, headache, blurred vision, numbness, weakness, rashes, diarrhea. She does admit to having some mild dysuria. She was seen by her family Dr. yesterday and had blood work drawn, she was told to come to the hospital because of "elevated enzymes". She does not know which enzymes these were, she does not have any symptoms at this time. She does not have exertional symptoms. She denies pain after eating and denies pain with laying down.  Patient is a 47 y.o. female presenting with chest pain. The history is provided by the patient and a relative.  Chest Pain     Past Medical History  Diagnosis Date  . Uterine fibroid   . Abdominal pain   . Hemorrhoids   . Dizziness   . Migraine     headaches more frequent since surgery     Past Surgical History  Procedure Date  . Total abdominal hysterectomy w/ bilateral salpingoophorectomy   . Hemorroidectomy     Dr Alvira Monday   . Cesarean section   . Abdominal hysterectomy   . Vaginal prolapse repair 03/28/2011    Procedure: VAGINAL VAULT SUSPENSION;   Surgeon: Genia Del;  Location: [facility];  Service: Gynecology;  Laterality: N/A;  Repair of Vaginal Cuff    Family History  Problem Relation Age of Onset  . Hypertension    . Prostate cancer    . Uterine cancer    . Cervical cancer Mother   . Prostate cancer Father   . Breast cancer Sister     History  Substance Use Topics  . Smoking status: Never Smoker   . Smokeless tobacco: Not on file  . Alcohol Use: No    OB History    Grav Para Term Preterm Abortions TAB SAB Ect Mult Living                  Review of Systems  Cardiovascular: Positive for chest pain.  All other systems reviewed and are negative.    Allergies  Review of patient's allergies indicates no known allergies.  Home Medications   Current Outpatient Rx  Name  Route  Sig  Dispense  Refill  . ASPIRIN-ACETAMINOPHEN-CAFFEINE 250-250-65 MG PO TABS   Oral   Take 1 tablet by mouth every 6 (six) hours as needed.           . ADULT MULTIVITAMIN W/MINERALS CH   Oral   Take 1 tablet by mouth daily.         Marland Kitchen NAPROXEN 500 MG PO TABS   Oral   Take 500 mg by mouth 2 (two) times  daily with a meal.         . NAPROXEN 500 MG PO TABS   Oral   Take 1 tablet (500 mg total) by mouth 2 (two) times daily with a meal.   30 tablet   0     BP 122/75  Pulse 67  Temp 98.5 F (36.9 C) (Oral)  Resp 11  SpO2 98%  LMP 10/15/2010  Physical Exam  Nursing note and vitals reviewed. Constitutional: She appears well-developed and well-nourished. No distress.  HENT:  Head: Normocephalic and atraumatic.  Mouth/Throat: Oropharynx is clear and moist. No oropharyngeal exudate.  Eyes: Conjunctivae normal and EOM are normal. Pupils are equal, round, and reactive to light. Right eye exhibits no discharge. Left eye exhibits no discharge. No scleral icterus.  Neck: Normal range of motion. Neck supple. No JVD present. No thyromegaly present.  Cardiovascular: Normal rate, regular rhythm, normal heart sounds and intact  distal pulses.  Exam reveals no gallop and no friction rub.   No murmur heard. Pulmonary/Chest: Effort normal and breath sounds normal. No respiratory distress. She has no wheezes. She has no rales.  Abdominal: Soft. Bowel sounds are normal. She exhibits no distension and no mass. There is tenderness ( Minimal tenderness in the suprapubic area, no tenderness in the right upper quadrant, no guarding, no Murphy sign, no pain at McBurney's point).       No peritoneal signs  Musculoskeletal: Normal range of motion. She exhibits no edema and no tenderness.  Lymphadenopathy:    She has no cervical adenopathy.  Neurological: She is alert. Coordination normal.  Skin: Skin is warm and dry. No rash noted. No erythema.  Psychiatric: She has a normal mood and affect. Her behavior is normal.    ED Course  Procedures (including critical care time)  Labs Reviewed  BASIC METABOLIC PANEL - Abnormal; Notable for the following:    Glucose, Bld 101 (*)     All other components within normal limits  HEPATIC FUNCTION PANEL - Abnormal; Notable for the following:    Alkaline Phosphatase 155 (*)     Total Bilirubin 0.2 (*)     All other components within normal limits  URINALYSIS, ROUTINE W REFLEX MICROSCOPIC - Abnormal; Notable for the following:    Leukocytes, UA SMALL (*)     All other components within normal limits  CBC  POCT I-STAT TROPONIN I  URINE MICROSCOPIC-ADD ON   Dg Chest 2 View  09/20/2012  *RADIOLOGY REPORT*  Clinical Data: 47 year old female with chest pain.  CHEST - 2 VIEW  Comparison: None  Findings: The cardiomediastinal silhouette is unremarkable. There is no evidence of focal airspace disease, pulmonary edema, suspicious pulmonary nodule/mass, pleural effusion, or pneumothorax. No acute bony abnormalities are identified. Remote left rib fractures are present.  IMPRESSION: No evidence of active cardiopulmonary disease.   Original Report Authenticated By: Harmon Pier, M.D.      1. Chest  pain   2. Myalgia       MDM  The patient is well-appearing, she has a normal EKG, she has normal lab work including an i-STAT troponin as well as a metabolic panel and a CBC. Her chest x-ray reveals no abnormalities and she has minimal symptoms over the evening today. The joint pain and muscle pain that she is having is quite unexplained at this time, her primary doctor has done some lab work and states that it would cover these ailments. There is no indication to repeat set rates, CRPs  or other autoimmune type illnesses this evening. I do not appreciate any joint swelling effusions or muscle tenderness on my exam. Her chest pain appears to be noncardiac in origin, it is certainly not exertional and not elevated troponin or abnormal EKG. Will perform liver function tests urinalysis, otherwise patient appears stable.   ED ECG REPORT  I personally interpreted this EKG   Date: 09/21/2012   Rate: 69  Rhythm: normal sinus rhythm  QRS Axis: normal  Intervals: normal  ST/T Wave abnormalities: normal  Conduction Disutrbances:none  Narrative Interpretation:   Old EKG Reviewed: none available  Patient reexamined, appears very stable, tests have been normal without any signs of elevated liver enzymes, cardiac enzymes or any other abnormalities. She's had a chest x-ray which is unremarkable and shows no signs of pulmonary or cardiac disease. I have given the patient reassurance, will give her a copy of her blood work to take to her doctor, stable for discharge at this time.    Vida Roller, MD 09/21/12 [phone]

## 2012-09-20 NOTE — ED Notes (Signed)
New EKG given to Dr Davidson. No old EKG. 

## 2012-09-20 NOTE — ED Notes (Signed)
Pt reports intermittent CP x "months" but denies at this time. Reports mild nausea today, denies vomiting. Pt reports 8/10 left chronic hip pain but reason for visit today was to follow up with pcp physician results. Unsure of what enzyme was elevated, but was instructed to come to the ED.

## 2012-09-20 NOTE — ED Provider Notes (Signed)
8:40 PM  Date: 09/20/2012  Rate: 69  Rhythm: normal sinus rhythm  QRS Axis: normal  Intervals: normal  ST/T Wave abnormalities: normal  Conduction Disutrbances:none  Narrative Interpretation: Normal EKG  Old EKG Reviewed: none available    Carleene Cooper III, MD 09/20/12 2041

## 2012-09-21 MED ORDER — NAPROXEN 500 MG PO TABS: 500.0000 mg | ORAL_TABLET | Freq: Two times a day (BID) | ORAL | Status: DC

## 2012-10-01 ENCOUNTER — Other Ambulatory Visit: Payer: Self-pay | Admitting: Family Medicine

## 2012-10-01 ENCOUNTER — Other Ambulatory Visit: Payer: Self-pay | Admitting: Obstetrics & Gynecology

## 2012-10-01 DIAGNOSIS — Z1231 Encounter for screening mammogram for malignant neoplasm of breast: Secondary | ICD-10-CM

## 2012-10-24 ENCOUNTER — Ambulatory Visit
Admission: RE | Admit: 2012-10-24 | Discharge: 2012-10-24 | Disposition: A | Discharge status: Home / Self Care | Payer: BC Managed Care – PPO | Source: Ambulatory Visit | Attending: Family Medicine | Admitting: Family Medicine

## 2012-10-24 DIAGNOSIS — Z1231 Encounter for screening mammogram for malignant neoplasm of breast: Secondary | ICD-10-CM

## 2012-10-27 ENCOUNTER — Other Ambulatory Visit: Payer: Self-pay | Admitting: Family Medicine

## 2012-10-27 DIAGNOSIS — R928 Other abnormal and inconclusive findings on diagnostic imaging of breast: Secondary | ICD-10-CM

## 2012-11-06 ENCOUNTER — Ambulatory Visit
Admission: RE | Admit: 2012-11-06 | Discharge: 2012-11-06 | Disposition: A | Discharge status: Home / Self Care | Payer: BC Managed Care – PPO | Source: Ambulatory Visit | Attending: Family Medicine | Admitting: Family Medicine

## 2012-11-06 DIAGNOSIS — R928 Other abnormal and inconclusive findings on diagnostic imaging of breast: Secondary | ICD-10-CM

## 2012-11-21 ENCOUNTER — Other Ambulatory Visit: Payer: Self-pay | Admitting: Family Medicine

## 2012-11-21 DIAGNOSIS — Z803 Family history of malignant neoplasm of breast: Secondary | ICD-10-CM

## 2012-11-21 DIAGNOSIS — N6489 Other specified disorders of breast: Secondary | ICD-10-CM

## 2012-12-05 ENCOUNTER — Other Ambulatory Visit: Payer: BC Managed Care – PPO

## 2013-05-06 ENCOUNTER — Other Ambulatory Visit: Payer: Self-pay | Admitting: Family Medicine

## 2013-05-06 DIAGNOSIS — N6310 Unspecified lump in the right breast, unspecified quadrant: Secondary | ICD-10-CM

## 2013-10-07 ENCOUNTER — Other Ambulatory Visit: Payer: Self-pay | Admitting: Family Medicine

## 2013-10-07 DIAGNOSIS — N6310 Unspecified lump in the right breast, unspecified quadrant: Secondary | ICD-10-CM

## 2013-10-07 DIAGNOSIS — Z803 Family history of malignant neoplasm of breast: Secondary | ICD-10-CM

## 2013-12-10 ENCOUNTER — Other Ambulatory Visit: Payer: Self-pay | Admitting: Family Medicine

## 2013-12-10 ENCOUNTER — Ambulatory Visit
Admission: RE | Admit: 2013-12-10 | Discharge: 2013-12-10 | Disposition: A | Discharge status: Home / Self Care | Payer: BC Managed Care – PPO | Source: Ambulatory Visit | Attending: Family Medicine | Admitting: Family Medicine

## 2013-12-10 ENCOUNTER — Ambulatory Visit
Admission: RE | Admit: 2013-12-10 | Discharge: 2013-12-10 | Disposition: A | Discharge status: Home / Self Care | Payer: Self-pay | Source: Ambulatory Visit | Attending: Family Medicine | Admitting: Family Medicine

## 2013-12-10 DIAGNOSIS — N6310 Unspecified lump in the right breast, unspecified quadrant: Secondary | ICD-10-CM

## 2013-12-10 DIAGNOSIS — Z803 Family history of malignant neoplasm of breast: Secondary | ICD-10-CM

## 2014-01-08 ENCOUNTER — Ambulatory Visit: Payer: Self-pay

## 2014-01-15 ENCOUNTER — Ambulatory Visit (INDEPENDENT_AMBULATORY_CARE_PROVIDER_SITE_OTHER): Payer: BC Managed Care – PPO

## 2014-01-15 VITALS — BP 129/80 | HR 80 | Resp 16 | Ht 66.0 in | Wt 168.0 lb

## 2014-01-15 DIAGNOSIS — M779 Enthesopathy, unspecified: Secondary | ICD-10-CM

## 2014-01-15 DIAGNOSIS — G9050 Complex regional pain syndrome I, unspecified: Secondary | ICD-10-CM

## 2014-01-15 DIAGNOSIS — IMO0002 Reserved for concepts with insufficient information to code with codable children: Secondary | ICD-10-CM

## 2014-01-15 DIAGNOSIS — M79609 Pain in unspecified limb: Secondary | ICD-10-CM

## 2014-01-15 DIAGNOSIS — M5416 Radiculopathy, lumbar region: Secondary | ICD-10-CM

## 2014-01-15 DIAGNOSIS — G5790 Unspecified mononeuropathy of unspecified lower limb: Secondary | ICD-10-CM

## 2014-01-15 MED ORDER — GABAPENTIN 300 MG PO CAPS: ORAL_CAPSULE | ORAL | Status: DC

## 2014-01-15 NOTE — Patient Instructions (Signed)
Peripheral Neuropathy Peripheral neuropathy is a type of nerve damage. It affects nerves that carry signals between the spinal cord and other parts of the body. These are called peripheral nerves. With peripheral neuropathy, one nerve or a group of nerves may be damaged.  CAUSES  Many things can damage peripheral nerves. For some people with peripheral neuropathy, the cause is unknown. Some causes include:  Diabetes. This is the most common cause of peripheral neuropathy.  Injury to a nerve.  Pressure or stress on a nerve that lasts a long time.  Too little vitamin B. Alcoholism can lead to this.  Infections.  Autoimmune diseases, such as multiple sclerosis and systemic lupus erythematosus.  Inherited nerve diseases.  Some medicines, such as cancer drugs.  Toxic substances, such as lead and mercury.  Too little blood flowing to the legs.  Kidney disease.  Thyroid disease. SIGNS AND SYMPTOMS  Different people have different symptoms. The symptoms you have will depend on which of your nerves is damaged. Common symptoms include:  Loss of feeling (numbness) in the feet and hands.  Tingling in the feet and hands.  Pain that burns.  Very sensitive skin.  Weakness.  Not being able to move a part of the body (paralysis).  Muscle twitching.  Clumsiness or poor coordination.  Loss of balance.  Not being able to control your bladder.  Feeling dizzy.  Sexual problems. DIAGNOSIS  Peripheral neuropathy is a symptom, not a disease. Finding the cause of peripheral neuropathy can be hard. To figure that out, your health care provider will take a medical history and do a physical exam. A neurological exam will also be done. This involves checking things affected by your brain, spinal cord, and nerves (nervous system). For example, your health care provider will check your reflexes, how you move, and what you can feel.  Other types of tests may also be ordered, such as:  Blood  tests.  A test of the fluid in your spinal cord.  Imaging tests, such as CT scans or an MRI.  Electromyography (EMG). This test checks the nerves that control muscles.  Nerve conduction velocity tests. These tests check how fast messages pass through your nerves.  Nerve biopsy. A small piece of nerve is removed. It is then checked under a microscope. TREATMENT   Medicine is often used to treat peripheral neuropathy. Medicines may include:  Pain-relieving medicines. Prescription or over-the-counter medicine may be suggested.  Antiseizure medicine. This may be used for pain.  Antidepressants. These also may help ease pain from neuropathy.  Lidocaine. This is a numbing medicine. You might wear a patch or be given a shot.  Mexiletine. This medicine is typically used to help control irregular heart rhythms.  Surgery. Surgery may be needed to relieve pressure on a nerve or to destroy a nerve that is causing pain.  Physical therapy to help movement.  Assistive devices to help movement. HOME CARE INSTRUCTIONS   Only take over-the-counter or prescription medicines as directed by your health care provider. Follow the instructions carefully for any given medicines. Do not take any other medicines without first getting approval from your health care provider.  If you have diabetes, work closely with your health care provider to keep your blood sugar under control.  If you have numbness in your feet:  Check every day for signs of injury or infection. Watch for redness, warmth, and swelling.  Wear padded socks and comfortable shoes. These help protect your feet.  Do not do   things that put pressure on your damaged nerve.  Do not smoke. Smoking keeps blood from getting to damaged nerves.  Avoid or limit alcohol. Too much alcohol can cause a lack of B vitamins. These vitamins are needed for healthy nerves.  Develop a good support system. Coping with peripheral neuropathy can be  stressful. Talk to a mental health specialist or join a support group if you are struggling.  Follow up with your health care provider as directed. SEEK MEDICAL CARE IF:   You have new signs or symptoms of peripheral neuropathy.  You are struggling emotionally from dealing with peripheral neuropathy.  You have a fever. SEEK IMMEDIATE MEDICAL CARE IF:   You have an injury or infection that is not healing.  You feel very dizzy or begin vomiting.  You have chest pain.  You have trouble breathing. Document Released: 08/10/2002 Document Revised: 05/02/2011 Document Reviewed: 04/27/2013 [facility] Patient Information 2014 [place].   Complex Regional Pain Syndrome Complex Regional Pain Syndrome (CRPS) is a nerve disorder that occurs at the site of an injury. It occurs especially after injuries from high-velocity impacts such as those from bullets or shrapnel. However, it may occur without apparent injury. The arms or legs are usually involved. SYMPTOMS  CRPS is a chronic condition characterized by:  Severe burning pain.  Changes in bone and skin.  Excessive sweating.  Tissue swelling.  Extreme sensitivity to touch. One visible sign of CRPS near the site of injury is warm, shiny, red skin that later becomes cool and bluish. The pain that patients report is out of proportion to the severity of the injury. The pain gets worse, rather than better, over time. Eventually the joints become stiff from disuse and the skin, muscles, and bone atrophy. The symptoms of CRPS vary in severity and duration. The cause of CRPS is unknown. The disorder is unique in that it simultaneously affects the:  Nerves.  Skin.  Muscles.  Blood vessels.  Bones. CRPS can strike at any age but is more common between the ages of 51 and 11. However, the number of CRPS cases among adolescents and young adults is increasing. CRPS is diagnosed primarily through observation of the symptoms. Some physicians  use thermography to detect changes in body temperature that are common in CRPS. X-rays may also show changes in the bone.  TREATMENT  Treatments include:  Physicians use a variety of drugs to treat CRPS.  Elevation of the extremity and physical therapy are also used to treat CRPS.  Injection of a local anesthetic.  Applying brief pulses of electricity to nerve endings under the skin (transcutaneous electrical stimulation or TENS).  In some cases, interruption of the affected portion of the sympathetic nervous system (surgical or chemical sympathectomy) is necessary to relieve pain. This involves cutting the nerve or nerves. Pain is destroyed almost instantly, but this treatment may also destroy other sensations. PROGNOSIS Good progress can be made in treating CRPS if treatment is begun early. Ideally treatment should begin within 3 months of the first symptoms. Early treatment often results in remission. If treatment is delayed, the disorder can quickly spread to the entire limb, and changes in bone and muscle may become irreversible. In 50 percent of CRPS cases, pain persists longer than 6 months and sometimes for years. Document Released: 08/10/2002 Document Revised: 11/12/2011 Document Reviewed: 06/30/2008 [facility] Patient Information 2014 [place], [place].

## 2014-01-15 NOTE — Progress Notes (Signed)
Subjective:    Patient ID: Linda Nelson, female    DOB: Feb 20, 1966, 48 y.o.   MRN: [id number]  HPI Comments: "I have pain in my feet and ankles"  Patient c/o aching dorsal/lateral foot and medial, anterior and lateral ankle for over 1 year. The areas are swollen. She stands a lot and is in extreme pain by the end of the day. She has seen Dr. Ferdinand Lango (podiatrist) and he gave cortisone injections and referred to Dr. Ouida Sills (rheumatologist). Dr. Ouida Sills said she had tendonitis and arthritis-Rx'd hydroxychloroquine(?) and prednisone. Went for 2nd opinion with Dr. Charlestine Night and told her to stop meds and gave Tramadol and told her no arthritis.  She had allergic reaction to Tramadol, so D/C and no longer on any meds for feet.   She is interested on laser treatment for toenails.  Foot Pain Associated symptoms include arthralgias, myalgias and a rash.      Review of Systems  Constitutional: Positive for activity change.  Cardiovascular:       Calf pain with walking   Endocrine: Positive for cold intolerance.  Musculoskeletal: Positive for arthralgias, back pain, gait problem and myalgias.  Skin: Positive for rash.       Change in nails  Hematological: Bruises/bleeds easily.  All other systems reviewed and are negative.      Objective:   Physical Exam 48 year old [place] American female presents at this time for initial visit she appears well-developed well-nourished and oriented x3. Patient did get emotional during the exam indicating she's been through several doctors and nobody is been able to help her with her pain. Lower extremity objective findings as follows vascular status appears to be intact with pedal pulses palpable DP and PT +2/4 bilateral should note that palpation of the foot provided patient with exquisite pain and discomfort she is very nervous and jittery about touching her foot over the dorsum of the foot medial malleoli are ankle area using a toe area any movement of  the toes with pain for a comfortable to the patient palpation around the ankles and lower legs is also uncomfortable to the patient patient seemed very anxious and agitated during the exam. Capillary refill time was 3 seconds all digits temperature was warm to cool turgor normal no edema noted at this time however patient had pictures of her feet and ankles showing edema around the ankles and blotchy edematous changes but she claims happening at different times a day or when she wakes up in the morning or by the end of the day after being on her feet for 10 or 12 hours. Patient describes pain around her legs ankles and feet described as a burning pain shooting stinging pain pins and needles sensations. Neurologically epicritic sensations and progressive sensations appear to be intact although hypersensitivity is noted DTRs not elicited there is normal plantar response noted. Dermatologically skin color pigment normal hair growth absent nails unremarkable. Orthopedic biomechanical exam reveals rectus foot type mild digital contractures noted the digits are rectus as is hallux. The ankle show some mild edema x-rays of the feet ankles 3 views of the ankles AP lateral and mortise views as well as AP views of the foot and oblique views of the foot failed to reduce reveal any severe osseous abnormalities are some slight Lisfranc subluxation on for fifth met he would articulation mild flexible digital contractures were noted from the ankle mortise is unremarkable no significant displacements no large osteophytes or spurs on either ankle he is intact tibial  rod or nail noted on the right lower leg patient underwent tib-fib fracture repair with the nailing. However the mortise was otherwise unremarkable       Assessment & Plan:  Assessment this time patient may have some mild degenerative changes of the ankle and Lisfranc joint subtalar and posterior facets and middle facets are unremarkable midtarsal joint is  unremarkable as well as far as radiographic evaluation her patient has exquisite pain tenderness on any palpation around the foot ankle malleoli are area or movement of the digits patient also indicates after questioning that she has had some back problems with back pain although she attributed to being secondary or associated to her foot pain. Patient is not been evaluated for back issues or had neurologic evaluation. Has been evaluated on 2 occasions by rheumatology 1 indicating arthritic problem however the second Dr. Joya San he discontinued her steroid medication placed her on tramadol which patient could not tolerate. Next  Cannot rule out the possibilities of chronic regional pain syndrome or CRPS versus a lumbar or peripheral neuropathy-type situation posse lumbar radiculopathy can't be ruled out. I tried to explain to the patient that I feel her pain is been caused by possibly some nerve damage or injury and some literature is for the patient for help understand this she had a difficult time grasping how her nurse can cause this problem.  Plan at this time patient is placed on a trial of gabapentin 3 mg each bedtime for 7 days then twice a day for 7 days and 3 times a day thereafter possible followup within a month or 2 however more urgent at this time is patient is advised and given a referral for neurologic evaluation and consultation. Arrangements is given at this timefor evaluation by [facility] patient did request family leave papers be filled out and she may need to discontinue some of her work activities due to prolonged pain followup with in one to 2 months for reevaluation and assessment of the Neurontin or gabapentin and stressed the importance of followup with a neurologist  Harriet Masson DPM

## 2014-01-18 ENCOUNTER — Telehealth: Payer: Self-pay | Admitting: *Deleted

## 2014-01-18 DIAGNOSIS — G8929 Other chronic pain: Secondary | ICD-10-CM

## 2014-01-18 DIAGNOSIS — M779 Enthesopathy, unspecified: Secondary | ICD-10-CM

## 2014-01-18 NOTE — Telephone Encounter (Addendum)
Pt presents to office, stating she will not leave without an appt wherever Dr. Blenda Mounts referred her.  I checked the dictation from 01/15/2014, pt was referred to [facility].  I informed pt, [facility]'s New Patient Coordinator would review Dr. Erasmo Downer documentation and call pt for an appt.  I copied referral sheet and underlined the [place] office phone number for pt to check on the referral on Thursday or Friday of this week.  Pt states she has a job, that she doesn't feel she can take Neurotin and perform properly, she asked for a different medication.  I told her I would present the request to Dr. Blenda Mounts and call again 01/19/2014.  Referral form, 01/15/2014 documentation, pt data, and insurance information was faxed to [facility].

## 2014-01-19 ENCOUNTER — Telehealth: Payer: Self-pay | Admitting: *Deleted

## 2014-01-19 NOTE — Telephone Encounter (Signed)
I called and informed her that Dr. Blenda Mounts said there's no other alternative medicine that would not have the same affect as Neurontin.  She stated that's okay, she's going to hold off on taking anything until she goes to see the Neurologist.

## 2014-01-19 NOTE — Telephone Encounter (Signed)
Entered in error

## 2014-01-19 NOTE — Telephone Encounter (Signed)
There is no other alternative medicine that would not have any the same side effects as Neurontin she's on a light dose to start with and should adjust or buildup tolerance to this.  Patient needs to keep her appointment with [facility] once an appointment has been scheduled

## 2014-01-22 ENCOUNTER — Encounter: Payer: Self-pay | Admitting: Neurology

## 2014-01-22 ENCOUNTER — Ambulatory Visit (INDEPENDENT_AMBULATORY_CARE_PROVIDER_SITE_OTHER): Payer: BC Managed Care – PPO | Admitting: Neurology

## 2014-01-22 ENCOUNTER — Encounter (INDEPENDENT_AMBULATORY_CARE_PROVIDER_SITE_OTHER): Payer: Self-pay

## 2014-01-22 VITALS — BP 149/85 | HR 81 | Temp 97.3°F | Ht 66.0 in | Wt 172.0 lb

## 2014-01-22 DIAGNOSIS — M79609 Pain in unspecified limb: Secondary | ICD-10-CM

## 2014-01-22 DIAGNOSIS — M545 Low back pain, unspecified: Secondary | ICD-10-CM

## 2014-01-22 DIAGNOSIS — M542 Cervicalgia: Secondary | ICD-10-CM

## 2014-01-22 DIAGNOSIS — M79673 Pain in unspecified foot: Secondary | ICD-10-CM

## 2014-01-22 HISTORY — DX: Pain in unspecified foot: M79.673
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
## 2014-01-22 NOTE — Patient Instructions (Addendum)
We will do further work up for your foot pain with electrical nerve and muscle testing in your feet (EMG and Nerve conduction test). We will also do some blood work and an MRI of your upper and lower back.  I recommend that you start gabapentin as per your podiatrist: you may want to start just at night. You can also increase more gradually.   Please ask family and friends or your significant other or bed partner if you snore and if so, how loud it is, and if you have breathing related issues in your sleep, such as: snorting sounds, choking sounds, pauses in your breathing or shallow breathing events. These may be symptoms of obstructive sleep apnea (OSA).

## 2014-01-24 LAB — RPR: SYPHILIS RPR SCR: NONREACTIVE

## 2014-01-24 LAB — SEDIMENTATION RATE: Sed Rate: 7 mm/hr (ref 0–32)

## 2014-01-24 LAB — B12 AND FOLATE PANEL
Folate: >19.9 ng/mL (ref >3.0)
Vitamin B-12: 713 pg/mL (ref 211–946)

## 2014-01-24 LAB — HGB A1C W/O EAG: HEMOGLOBIN A1C: 5.9 % — AB (ref 4.8–5.6)

## 2014-01-24 LAB — ALDOLASE: ALDOLASE: 12.1 U/L — AB (ref 3.3–10.3)

## 2014-01-24 LAB — CK: Total CK: 201 U/L — ABNORMAL HIGH (ref 24–173)

## 2014-01-24 LAB — TSH: TSH: 2.02 u[IU]/mL (ref 0.450–4.500)

## 2014-01-24 LAB — C-REACTIVE PROTEIN: CRP: 3.2 mg/L (ref 0.0–4.9)

## 2014-01-24 LAB — VITAMIN D 25 HYDROXY (VIT D DEFICIENCY, FRACTURES): VIT D 25 HYDROXY: 37.1 ng/mL (ref 30.0–100.0)

## 2014-01-25 NOTE — Progress Notes (Signed)
Quick Note:  Please advise patient that her labs were for the most part normal. Her hemoglobin A1c which is a marker for diabetes is slightly elevated, which indicates an increased risk for diabetes but not frank diabetes. She has a very mild increase in her muscle enzymes which in and of itself is a fairly nonspecific finding and we will await her further tests. Star Age, MD, PhD [facility] ([facility]  ______

## 2014-01-26 ENCOUNTER — Ambulatory Visit: Payer: BC Managed Care – PPO | Admitting: Neurology

## 2014-01-28 ENCOUNTER — Ambulatory Visit (INDEPENDENT_AMBULATORY_CARE_PROVIDER_SITE_OTHER): Payer: BC Managed Care – PPO

## 2014-01-28 DIAGNOSIS — M545 Low back pain, unspecified: Secondary | ICD-10-CM

## 2014-01-28 DIAGNOSIS — M79673 Pain in unspecified foot: Secondary | ICD-10-CM

## 2014-01-28 DIAGNOSIS — M79609 Pain in unspecified limb: Secondary | ICD-10-CM

## 2014-01-28 DIAGNOSIS — M542 Cervicalgia: Secondary | ICD-10-CM

## 2014-01-28 NOTE — Progress Notes (Signed)
Quick Note:  Pt calling back today for results. I gave her the results. She will come in for MRI today. Results can be given to her along with Mychart activation sheet. ______

## 2014-01-29 ENCOUNTER — Telehealth: Payer: Self-pay | Admitting: *Deleted

## 2014-01-29 NOTE — Telephone Encounter (Signed)
Patient was scheduled an appointment with Dr. Krista Blue at [facility] on 01/26/2014 at 10am.

## 2014-02-04 ENCOUNTER — Telehealth: Payer: Self-pay | Admitting: Neurology

## 2014-02-04 NOTE — Progress Notes (Signed)
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 Pt verbalized understanding. ______

## 2014-02-04 NOTE — Telephone Encounter (Signed)
Patient calling to state that she had an MRI a week ago and is requesting results, wants to discuss the results the same time as when she is coming in for her NCV/EMG on 02/24/14. Please return call and advise patient.

## 2014-02-05 NOTE — Telephone Encounter (Signed)
Patient said that results were already given to her by Nurse Lovey Newcomer.

## 2014-02-10 ENCOUNTER — Encounter: Payer: BC Managed Care – PPO | Admitting: Neurology

## 2014-02-10 ENCOUNTER — Encounter: Payer: BC Managed Care – PPO | Admitting: Radiology

## 2014-02-22 NOTE — Progress Notes (Signed)
Quick Note:  I notified pt of results of MRI cervical. 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 This is an addended note from previous note 02-04-14. ______

## 2014-02-24 ENCOUNTER — Encounter (INDEPENDENT_AMBULATORY_CARE_PROVIDER_SITE_OTHER): Payer: Self-pay

## 2014-02-24 ENCOUNTER — Ambulatory Visit (INDEPENDENT_AMBULATORY_CARE_PROVIDER_SITE_OTHER): Payer: BC Managed Care – PPO | Admitting: Neurology

## 2014-02-24 DIAGNOSIS — M79673 Pain in unspecified foot: Secondary | ICD-10-CM

## 2014-02-24 DIAGNOSIS — M79605 Pain in left leg: Secondary | ICD-10-CM

## 2014-02-24 DIAGNOSIS — M542 Cervicalgia: Secondary | ICD-10-CM

## 2014-02-24 DIAGNOSIS — M79609 Pain in unspecified limb: Secondary | ICD-10-CM

## 2014-02-24 DIAGNOSIS — M79604 Pain in right leg: Secondary | ICD-10-CM

## 2014-02-24 DIAGNOSIS — M545 Low back pain, unspecified: Secondary | ICD-10-CM

## 2014-02-24 DIAGNOSIS — Z0289 Encounter for other administrative examinations: Secondary | ICD-10-CM

## 2014-02-24 NOTE — Procedures (Signed)
     HISTORY:  Linda Nelson is a 48 year old patient with a chronic history of bilateral foot discomfort that is present with weightbearing and when she is off of her feet. She reports pain particularly with plantar flexion of the feet and the pain is in the anterior portion of the ankle primarily. The patient is being evaluated for possible neuropathy or a lumbosacral radiculopathy. She does report some low back pain.  NERVE CONDUCTION STUDIES:  Nerve conduction studies were performed on both lower extremities. The distal motor latencies and motor amplitudes for the peroneal and posterior tibial nerves were within normal limits. The nerve conduction velocities for these nerves were also normal. The H reflex latencies were normal. The sensory latencies for the peroneal nerves were within normal limits. The medial and lateral plantar sensory latencies are symmetric and within normal limits bilaterally.   EMG STUDIES:  EMG study was performed on the right lower extremity:  The tibialis anterior muscle reveals 2 to 4K motor units with full recruitment. No fibrillations or positive waves were seen. The peroneus tertius muscle reveals 2 to 4K motor units with full recruitment. No fibrillations or positive waves were seen. The medial gastrocnemius muscle reveals 1 to 3K motor units with full recruitment. No fibrillations or positive waves were seen. The vastus lateralis muscle reveals 2 to 4K motor units with full recruitment. No fibrillations or positive waves were seen. The iliopsoas muscle reveals 2 to 4K motor units with full recruitment. No fibrillations or positive waves were seen. The biceps femoris muscle (long head) reveals 2 to 4K motor units with full recruitment. No fibrillations or positive waves were seen. The lumbosacral paraspinal muscles were tested at 3 levels, and revealed no abnormalities of insertional activity at all 3 levels tested. There was fair relaxation.  EMG study was  performed on the left lower extremity:  The tibialis anterior muscle reveals 2 to 4K motor units with full recruitment. No fibrillations or positive waves were seen. The peroneus tertius muscle reveals 2 to 4K motor units with full recruitment. No fibrillations or positive waves were seen. The medial gastrocnemius muscle reveals 1 to 3K motor units with full recruitment. No fibrillations or positive waves were seen. The vastus lateralis muscle reveals 2 to 4K motor units with full recruitment. No fibrillations or positive waves were seen. The iliopsoas muscle reveals 2 to 4K motor units with full recruitment. No fibrillations or positive waves were seen. The biceps femoris muscle (long head) reveals 2 to 4K motor units with full recruitment. No fibrillations or positive waves were seen. The lumbosacral paraspinal muscles were tested at 3 levels, and revealed no abnormalities of insertional activity at all 3 levels tested. There was fair relaxation.   IMPRESSION:  Nerve conduction studies done on both lower extremities were within normal limits. No evidence of a peripheral neuropathy is seen. A small fiber neuropathy may be missed by a standard nerve conduction studies, however. Clinical correlation is required. EMG evaluation of both lower extremities is unremarkable, without evidence of an overlying lumbosacral radiculopathy on either side.  Jill Alexanders MD 02/24/2014 2:08 PM  [facility] [address] [place], [place] [address]  Phone [phone] Fax [phone]

## 2014-02-25 NOTE — Progress Notes (Signed)
Quick Note:  Please call and advise the patient that the recent EMG and nerve conduction velocity test, which is the electrical nerve and muscle test we we performed, was reported as within normal limits. However, this test can miss a small fiber neuropathy. We checked for abnormal electrical discharges in the muscles or nerves and the report suggested normal findings. No further action is required on this test at this time. Please remind patient to keep any upcoming appointments or tests and to call us with any interim questions, concerns, problems or updates. Thanks,  Star Age, MD, PhD   ______

## 2014-02-26 ENCOUNTER — Encounter: Payer: Self-pay | Admitting: *Deleted

## 2014-02-26 NOTE — Progress Notes (Signed)
Quick Note:  Mailed copy of report and comment note to pt. ______

## 2014-03-09 ENCOUNTER — Ambulatory Visit: Payer: BC Managed Care – PPO | Admitting: Podiatry

## 2014-04-06 ENCOUNTER — Encounter: Payer: Self-pay | Admitting: Podiatry

## 2014-04-06 ENCOUNTER — Ambulatory Visit (INDEPENDENT_AMBULATORY_CARE_PROVIDER_SITE_OTHER): Payer: BC Managed Care – PPO | Admitting: Podiatry

## 2014-04-06 VITALS — BP 126/80 | HR 83 | Resp 16

## 2014-04-06 DIAGNOSIS — R0989 Other specified symptoms and signs involving the circulatory and respiratory systems: Secondary | ICD-10-CM

## 2014-04-06 DIAGNOSIS — I872 Venous insufficiency (chronic) (peripheral): Secondary | ICD-10-CM

## 2014-04-06 DIAGNOSIS — L608 Other nail disorders: Secondary | ICD-10-CM

## 2014-04-06 NOTE — Progress Notes (Signed)
She presents today for followup of her painful legs bilaterally. She was initially a patient of Dr. Erasmo Downer. She was seen by the neurologist who performed an EMG and nerve conduction velocity exam both of which were negative. She still experiences severe swelling and pain by the end of the day. And she demonstrates a picture to me that shows severe venous insufficiency and bulging of the veins in her bilateral legs. She states that this is most painful.  Objective: Vital signs are stable she is alert and oriented x3. She has no reproducible pain on physical evaluation and no varicosities are seen. I reviewed her old radiographs which do not demonstrate any type of osteoarthritis or seropositive arthropathy. He negative find at the neurologist and are negative fine with rheumatology leads only vascular and or the for problems. Hallux nails appear to be dystrophic and thick possibly mycotic they are painful.  Assessment: Possible venous insufficiency resulting in current symptoms. Dystrophic nails hallux bilateral.  Plan: Discussed etiology pathology conservative versus surgical therapies. We are referring her to the vascular department for evaluation. Arterial flow as well as venous insufficiency. We also took a sample of her hallux nails bilaterally today there sent for evaluation.

## 2014-04-07 ENCOUNTER — Telehealth: Payer: Self-pay | Admitting: *Deleted

## 2014-04-07 NOTE — Telephone Encounter (Signed)
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   [facility] [address] [place] [address]  Ph: [phone]

## 2014-04-09 ENCOUNTER — Telehealth (HOSPITAL_COMMUNITY): Payer: Self-pay | Admitting: *Deleted

## 2014-04-16 ENCOUNTER — Ambulatory Visit (HOSPITAL_COMMUNITY)
Admission: RE | Admit: 2014-04-16 | Discharge: 2014-04-16 | Disposition: A | Discharge status: Home / Self Care | Payer: BC Managed Care – PPO | Source: Ambulatory Visit | Attending: Podiatry | Admitting: Podiatry

## 2014-04-16 DIAGNOSIS — I872 Venous insufficiency (chronic) (peripheral): Secondary | ICD-10-CM | POA: Diagnosis present

## 2014-04-16 DIAGNOSIS — R0989 Other specified symptoms and signs involving the circulatory and respiratory systems: Secondary | ICD-10-CM | POA: Insufficient documentation

## 2014-04-16 DIAGNOSIS — M79606 Pain in leg, unspecified: Secondary | ICD-10-CM

## 2014-04-16 NOTE — Progress Notes (Signed)
Lower Extremity Venous Duplex Completed. No evidence for DVT, SVT, or venous insufficiency. [facility]

## 2014-04-16 NOTE — Progress Notes (Signed)
Lower Extremity Arterial Duplex Completed. No evidence for arterial insufficiency. °Brianna L Mazza,RVT °

## 2014-04-19 ENCOUNTER — Telehealth: Payer: Self-pay | Admitting: *Deleted

## 2014-04-19 NOTE — Telephone Encounter (Signed)
Entered in error under wrong name

## 2014-04-19 NOTE — Telephone Encounter (Signed)
My chest hurts when I lay down to go to sleep and when I'm at work.  I told her she may need to go to the emergency room.  Is there something he can give me for pain or can I come in sooner?  I don't want a narcotic, because they can cause drowsiness.  Can't have that on my job.  I reiterated to her if you are having chest pains you need to go to the emergency room.  I told her I would see what Dr. Milinda Pointer suggests about pain medicine and give you a call back.

## 2014-04-19 NOTE — Telephone Encounter (Signed)
Had some tests done Friday.  I want to see if I can come in as soon as possible.  The test he done is getting worse and my chest is getting worse and he didn't do any test on my chest.  Thank You!  I attempted to call the patient.  I left her a message to give me a call back.

## 2014-04-19 NOTE — Telephone Encounter (Signed)
I called and informed her that we have not received the test results.  He cannot prescribe anything for pain because he does not know where the pain is originating from.  Once that is determined then he may be able to prescribe something.  I told her he suggests you go to the emergency room or follow up with vascular.  He said you could be having a pulmonary embolism, can't tell.  So I reiterated that she go to the emergency room.  She then asked when will we get the results.  I told her I don't know.  She stated okay thank you.

## 2014-04-19 NOTE — Telephone Encounter (Signed)
We have nothing to do with the test or the procedures.  Should she be developing pain in her legs and chest then she should contact the vascular group or go to the emergency room.  She could be having a PE (pulmonary embolism).  We will not give medication for the pain in her legs.  Once we determine where the pain is originating from then we can see about medication.  We do not have the results as of yet.  No reason to come see [place] yet.

## 2014-04-20 ENCOUNTER — Telehealth: Payer: Self-pay | Admitting: *Deleted

## 2014-04-20 NOTE — Telephone Encounter (Signed)
I need to speak to Dr. Stephenie Acres nurse.  I'll call back.

## 2014-04-20 NOTE — Telephone Encounter (Signed)
My chest pain has eased up.  I haven't gone to the doctor yet.  I have a break around 11:30am and I plan on going over to the vascular place then.  I told her she may need to call for an appointment.  She asked if I could call there and do it because she is at work.  I told her I would see what I could do and call her back.  She asked if she could call herself.  I told her yes.  She said she was going to call now.

## 2014-04-23 ENCOUNTER — Encounter: Payer: Self-pay | Admitting: Cardiovascular Disease

## 2014-04-23 ENCOUNTER — Telehealth: Payer: Self-pay | Admitting: Cardiovascular Disease

## 2014-04-23 ENCOUNTER — Ambulatory Visit (INDEPENDENT_AMBULATORY_CARE_PROVIDER_SITE_OTHER): Payer: BC Managed Care – PPO | Admitting: Cardiovascular Disease

## 2014-04-23 VITALS — BP 110/82 | HR 76 | Ht 65.5 in | Wt 175.7 lb

## 2014-04-23 DIAGNOSIS — R0789 Other chest pain: Secondary | ICD-10-CM

## 2014-04-23 DIAGNOSIS — M79609 Pain in unspecified limb: Secondary | ICD-10-CM

## 2014-04-23 DIAGNOSIS — M79673 Pain in unspecified foot: Secondary | ICD-10-CM

## 2014-04-23 NOTE — Telephone Encounter (Signed)
Returned call to patient she stated she cannot get appointment to see foot Dr.until 05/11/14.Stated she wanted to know if Dr.Berry can give her a temporary note to sit at her job instead of standing,to see if this would help her leg pain.Message sent to [facility] for advice.

## 2014-04-23 NOTE — Telephone Encounter (Signed)
Pt came to the office to pick letter up.

## 2014-04-23 NOTE — Assessment & Plan Note (Signed)
The patient was referred to me by Dr. Scherrie November, [facility] , for evaluation of bilateral foot pain. She is seen multiple podiatrist the past as well as rheumatologist. There apparently various diagnoses and she was sent here to rule out a vascular cause. She has pain in the dorsal aspect of both feet with some mild pocket of swelling of both feet. Arterial venous Doppler studies were entirely normal. She has 2+ pedal pulses bilaterally. I doubt whether her symptoms are vascular in nature.

## 2014-04-23 NOTE — Telephone Encounter (Signed)
Letter is ready for pt, left message for her to call. ? What to do with the letter.
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
## 2014-04-23 NOTE — Patient Instructions (Signed)
Follow up as needed

## 2014-04-23 NOTE — Telephone Encounter (Signed)
Linda Nelson is calling because she wants to ask Dr. Gwenlyn Found if he could write a note stating whether a sit down job will help to see if there will be a difference in her legs from standing .Marland Kitchen Please call    Thanks

## 2014-04-29 ENCOUNTER — Telehealth: Payer: Self-pay | Admitting: *Deleted

## 2014-04-29 NOTE — Telephone Encounter (Signed)
I called and left her a message that both doppler studies were normal.  Dr. Milinda Pointer will follow-up with you at your next scheduled appointment.  Call if you have any questions or concerns.

## 2014-04-29 NOTE — Telephone Encounter (Signed)
Message copied by Lolita Rieger on Thu Apr 29, 2014 11:53 AM ------      Message from: Tyson Dense T      Created: Tue Apr 20, 2014  5:12 PM       Normal exam.  Inform patient and follow up with me as at scheduled appointment. ------

## 2014-05-11 ENCOUNTER — Ambulatory Visit: Payer: BC Managed Care – PPO | Admitting: Podiatry

## 2014-05-25 ENCOUNTER — Ambulatory Visit: Payer: BC Managed Care – PPO | Admitting: Podiatry

## 2014-05-26 ENCOUNTER — Telehealth: Payer: Self-pay | Admitting: *Deleted

## 2014-05-26 NOTE — Telephone Encounter (Signed)
Before he goes on vacation next week, can he please call me in some kind of Ibuprofen to take until I can see him?  I been in pain for months.  I cannot take a narcotic but this could help until I can see him.  Please prescribe it, thank you.

## 2014-05-27 ENCOUNTER — Telehealth: Payer: Self-pay | Admitting: *Deleted

## 2014-05-27 ENCOUNTER — Other Ambulatory Visit: Payer: Self-pay | Admitting: Podiatry

## 2014-05-27 MED ORDER — IBUPROFEN 600 MG PO TABS: 600.0000 mg | ORAL_TABLET | Freq: Three times a day (TID) | ORAL | Status: DC | PRN

## 2014-05-27 MED ORDER — IBUPROFEN 600 MG PO TABS: 600.0000 mg | ORAL_TABLET | Freq: Three times a day (TID) | ORAL | Status: DC

## 2014-05-27 NOTE — Telephone Encounter (Signed)
I ordered motrin 600mg  tid with food.  She can pick it up at the [facility] [address] [facility]

## 2014-05-27 NOTE — Telephone Encounter (Signed)
I called and informed the patient that Dr. Marta Antu a prescription for Motrin and sent it to [place].  She stated, "I don't use them anymore.  They were supposed to transfer all my prescriptions to [facility].  They are way across town."  I told her I would make the change and send it to [facility].  She asked, "Will it be ready this morning?"  I informed her I can't determine that but I'm going to send the order over now.  It may be ready this afternoon.  She stated, "Okay, thank you."

## 2014-05-27 NOTE — Telephone Encounter (Signed)
I left a message earlier for Dr. Milinda Pointer to see if he could call me in a prescription for Ibuprofen.  For months now, I have not had any medicine, trying to see what's going on.  Anything will help until I can see him in October.  I contacted the patient this morning, taken care of already.

## 2014-05-28 ENCOUNTER — Telehealth: Payer: Self-pay | Admitting: *Deleted

## 2014-05-28 NOTE — Telephone Encounter (Signed)
Just leave me a voicemail letting me know the results of the toe fungus you guys took a sample of.  It should be back by now. It was done about a month ago.  You can call me and leave a message of those results.  Thank you.

## 2014-06-02 NOTE — Telephone Encounter (Signed)
I called and informed her via voicemail that we did receive the culture back  It was negative for fungus.  Dr. Milinda Pointer stated that if you want something to thin your nails, we can prescribe Nuvail.  Please call and let us know if you are interested.

## 2014-06-09 ENCOUNTER — Encounter: Payer: Self-pay | Admitting: Podiatry

## 2014-06-15 ENCOUNTER — Ambulatory Visit: Payer: BC Managed Care – PPO | Admitting: Podiatry

## 2014-06-15 NOTE — Telephone Encounter (Signed)
I attempted to call the patient.  I told her I was returning her call from yesterday.

## 2014-06-28 ENCOUNTER — Telehealth: Payer: Self-pay | Admitting: Neurology

## 2014-06-28 ENCOUNTER — Ambulatory Visit: Payer: BC Managed Care – PPO | Admitting: Neurology

## 2014-06-28 NOTE — Telephone Encounter (Signed)
Patient no showed for an appointment today, 06/28/2014, at 1430.

## 2014-06-29 DIAGNOSIS — M25579 Pain in unspecified ankle and joints of unspecified foot: Secondary | ICD-10-CM | POA: Insufficient documentation

## 2014-06-29 DIAGNOSIS — M65979 Unspecified synovitis and tenosynovitis, unspecified ankle and foot: Secondary | ICD-10-CM

## 2014-06-29 DIAGNOSIS — M659 Synovitis and tenosynovitis, unspecified: Secondary | ICD-10-CM

## 2014-06-29 HISTORY — DX: Unspecified synovitis and tenosynovitis, unspecified ankle and foot: M65.979

## 2014-06-29 HISTORY — DX: Synovitis and tenosynovitis, unspecified: M65.9

## 2014-06-30 ENCOUNTER — Encounter: Payer: Self-pay | Admitting: Neurology

## 2014-12-09 ENCOUNTER — Other Ambulatory Visit: Payer: Self-pay

## 2014-12-09 DIAGNOSIS — Z1231 Encounter for screening mammogram for malignant neoplasm of breast: Secondary | ICD-10-CM

## 2014-12-13 ENCOUNTER — Ambulatory Visit
Admission: RE | Admit: 2014-12-13 | Discharge: 2014-12-13 | Disposition: A | Discharge status: Home / Self Care | Payer: BLUE CROSS/BLUE SHIELD | Source: Ambulatory Visit

## 2014-12-13 DIAGNOSIS — Z1231 Encounter for screening mammogram for malignant neoplasm of breast: Secondary | ICD-10-CM

## 2015-10-27 ENCOUNTER — Ambulatory Visit
Admission: RE | Admit: 2015-10-27 | Discharge: 2015-10-27 | Disposition: A | Discharge status: Home / Self Care | Payer: BLUE CROSS/BLUE SHIELD | Source: Ambulatory Visit | Attending: Family Medicine | Admitting: Family Medicine

## 2015-10-27 ENCOUNTER — Other Ambulatory Visit: Payer: Self-pay | Admitting: Family Medicine

## 2015-10-27 DIAGNOSIS — M25472 Effusion, left ankle: Secondary | ICD-10-CM

## 2015-10-27 DIAGNOSIS — M25471 Effusion, right ankle: Secondary | ICD-10-CM

## 2015-12-07 ENCOUNTER — Other Ambulatory Visit: Payer: Self-pay

## 2015-12-07 DIAGNOSIS — Z1231 Encounter for screening mammogram for malignant neoplasm of breast: Secondary | ICD-10-CM

## 2016-01-03 DIAGNOSIS — M79642 Pain in left hand: Secondary | ICD-10-CM | POA: Diagnosis not present

## 2016-01-03 DIAGNOSIS — M064 Inflammatory polyarthropathy: Secondary | ICD-10-CM | POA: Diagnosis not present

## 2016-01-03 DIAGNOSIS — M79641 Pain in right hand: Secondary | ICD-10-CM | POA: Diagnosis not present

## 2016-01-03 DIAGNOSIS — M25572 Pain in left ankle and joints of left foot: Secondary | ICD-10-CM | POA: Diagnosis not present

## 2016-01-05 ENCOUNTER — Other Ambulatory Visit: Payer: Self-pay | Admitting: Rheumatology

## 2016-01-05 DIAGNOSIS — M130 Polyarthritis, unspecified: Secondary | ICD-10-CM

## 2016-01-07 DIAGNOSIS — R109 Unspecified abdominal pain: Secondary | ICD-10-CM | POA: Diagnosis not present

## 2016-01-07 DIAGNOSIS — K644 Residual hemorrhoidal skin tags: Secondary | ICD-10-CM | POA: Diagnosis not present

## 2016-01-07 DIAGNOSIS — I10 Essential (primary) hypertension: Secondary | ICD-10-CM | POA: Insufficient documentation

## 2016-01-07 DIAGNOSIS — R8290 Unspecified abnormal findings in urine: Secondary | ICD-10-CM | POA: Diagnosis not present

## 2016-01-09 ENCOUNTER — Ambulatory Visit: Payer: BLUE CROSS/BLUE SHIELD

## 2016-02-20 ENCOUNTER — Ambulatory Visit: Payer: BLUE CROSS/BLUE SHIELD

## 2016-03-05 ENCOUNTER — Ambulatory Visit: Payer: BLUE CROSS/BLUE SHIELD

## 2016-03-12 DIAGNOSIS — R109 Unspecified abdominal pain: Secondary | ICD-10-CM | POA: Diagnosis not present

## 2016-03-12 DIAGNOSIS — R350 Frequency of micturition: Secondary | ICD-10-CM | POA: Diagnosis not present

## 2016-03-12 DIAGNOSIS — R079 Chest pain, unspecified: Secondary | ICD-10-CM | POA: Diagnosis not present

## 2016-03-14 ENCOUNTER — Emergency Department (HOSPITAL_COMMUNITY)
Admission: EM | Admit: 2016-03-14 | Discharge: 2016-03-14 | Disposition: A | Discharge status: Home / Self Care | Payer: BLUE CROSS/BLUE SHIELD | Attending: Emergency Medicine | Admitting: Emergency Medicine

## 2016-03-14 ENCOUNTER — Emergency Department (HOSPITAL_COMMUNITY): Payer: BLUE CROSS/BLUE SHIELD

## 2016-03-14 ENCOUNTER — Encounter (HOSPITAL_COMMUNITY): Payer: Self-pay

## 2016-03-14 DIAGNOSIS — R079 Chest pain, unspecified: Secondary | ICD-10-CM

## 2016-03-14 DIAGNOSIS — R0789 Other chest pain: Secondary | ICD-10-CM | POA: Insufficient documentation

## 2016-03-14 DIAGNOSIS — R0602 Shortness of breath: Secondary | ICD-10-CM | POA: Diagnosis not present

## 2016-03-14 DIAGNOSIS — I10 Essential (primary) hypertension: Secondary | ICD-10-CM | POA: Insufficient documentation

## 2016-03-14 HISTORY — DX: Essential (primary) hypertension: I10

## 2016-03-14 LAB — CBC
HEMATOCRIT: 41.7 % (ref 36.0–46.0)
HEMOGLOBIN: 13.5 g/dL (ref 12.0–15.0)
MCH: 29.1 pg (ref 26.0–34.0)
MCHC: 32.4 g/dL (ref 30.0–36.0)
MCV: 89.9 fL (ref 78.0–100.0)
Platelets: 221 10*3/uL (ref 150–400)
RBC: 4.64 MIL/uL (ref 3.87–5.11)
RDW: 13.5 % (ref 11.5–15.5)
WBC: 4.9 10*3/uL (ref 4.0–10.5)

## 2016-03-14 LAB — I-STAT TROPONIN, ED
TROPONIN I, POC: 0 ng/mL (ref 0.00–0.08)
Troponin i, poc: 0 ng/mL (ref 0.00–0.08)

## 2016-03-14 LAB — BASIC METABOLIC PANEL
ANION GAP: 8 (ref 5–15)
BUN: 18 mg/dL (ref 6–20)
CO2: 25 mmol/L (ref 22–32)
Calcium: 9.7 mg/dL (ref 8.9–10.3)
Chloride: 106 mmol/L (ref 101–111)
Creatinine, Ser: 0.73 mg/dL (ref 0.44–1.00)
GFR calc Af Amer: >60 mL/min (ref >60)
GLUCOSE: 88 mg/dL (ref 65–99)
POTASSIUM: 4.2 mmol/L (ref 3.5–5.1)
Sodium: 139 mmol/L (ref 135–145)

## 2016-03-14 NOTE — ED Notes (Signed)
Patient here with 2 weeks of intermittent chest tightness with shortness of breath. Saw her primary MD this past Monday and had normal EKG. Is to have appt with cardiology and didn't want to wait due to the ongoing pain

## 2016-03-14 NOTE — Discharge Instructions (Signed)
Please continue to follow-up with your primary care doctor and cardiologist. Return for worsening symptoms, including escalating pain, difficulty breathing, passing out or any other symptoms concerning to you.  Nonspecific Chest Pain It is often hard to find the cause of chest pain. There is always a chance that your pain could be related to something serious, such as a heart attack or a blood clot in your lungs. Chest pain can also be caused by conditions that are not life-threatening. If you have chest pain, it is very important to follow up with your doctor.  HOME CARE  If you were prescribed an antibiotic medicine, finish it all even if you start to feel better.  Avoid any activities that cause chest pain.  Do not use any tobacco products, including cigarettes, chewing tobacco, or electronic cigarettes. If you need help quitting, ask your doctor.  Do not drink alcohol.  Take medicines only as told by your doctor.  Keep all follow-up visits as told by your doctor. This is important. This includes any further testing if your chest pain does not go away.  Your doctor may tell you to keep your head raised (elevated) while you sleep.  Make lifestyle changes as told by your doctor. These may include:  Getting regular exercise. Ask your doctor to suggest some activities that are safe for you.  Eating a heart-healthy diet. Your doctor or a diet specialist (dietitian) can help you to learn healthy eating options.  Maintaining a healthy weight.  Managing diabetes, if necessary.  Reducing stress. GET HELP IF:  Your chest pain does not go away, even after treatment.  You have a rash with blisters on your chest.  You have a fever. GET HELP RIGHT AWAY IF:  Your chest pain is worse.  You have an increasing cough, or you cough up blood.  You have severe belly (abdominal) pain.  You feel extremely weak.  You pass out (faint).  You have chills.  You have sudden, unexplained  chest discomfort.  You have sudden, unexplained discomfort in your arms, back, neck, or jaw.  You have shortness of breath at any time.  You suddenly start to sweat, or your skin gets clammy.  You feel nauseous.  You vomit.  You suddenly feel light-headed or dizzy.  Your heart begins to beat quickly, or it feels like it is skipping beats. These symptoms may be an emergency. Do not wait to see if the symptoms will go away. Get medical help right away. Call your local emergency services (911 in the [place] Do not drive yourself to the hospital.   This information is not intended to replace advice given to you by your health care provider. Make sure you discuss any questions you have with your health care provider.   Document Released: 02/06/2008 Document Revised: 09/10/2014 Document Reviewed: 03/26/2014 Elsevier Interactive Patient Education [employer].

## 2016-03-14 NOTE — ED Notes (Signed)
Pt states she doesn't want her EKG done because it will cost her money, informed Liu-MD.

## 2016-03-14 NOTE — ED Provider Notes (Signed)
CSN: [id number]     Arrival date & time 03/14/16  1409 History   First MD Initiated Contact with Patient 03/14/16 1642     Chief Complaint  Patient presents with  . Chest Pain     (Consider location/radiation/quality/duration/timing/severity/associated sxs/prior Treatment) HPI 50 year old female who presents with chest pain. History of HTN. States intermittent chest pressure, non-exertional, occuring randomly over past 2-3 weeks. Non-radiating in center of chest.  Occuring almost daily and associated with mild dyspnea. No syncope or near syncope, diaphoresis, N/V, LE edema, calf tenderness, orthopnea, PND. No personal or family history of DVT/PE. No recent immobilization or exogenous hormones. Family history of CAD but in late 42-70s. Seen by PCP and referred for outpatient cardiology. Patient states she could not wait.    Past Medical History  Diagnosis Date  . Uterine fibroid   . Abdominal pain   . Hemorrhoids   . Dizziness   . Migraine     headaches more frequent since surgery   . Foot pain 01/22/2014  . Hypertension    Past Surgical History  Procedure Laterality Date  . Total abdominal hysterectomy w/ bilateral salpingoophorectomy    . Hemorroidectomy      Dr Alessandra Bevels   . Cesarean section    . Abdominal hysterectomy    . Vaginal prolapse repair  03/28/2011    Procedure: VAGINAL VAULT SUSPENSION;  Surgeon: Princess Bruins;  Location: [facility];  Service: Gynecology;  Laterality: N/A;  Repair of Vaginal Cuff   Family History  Problem Relation Age of Onset  . Hypertension    . Prostate cancer    . Uterine cancer    . Cervical cancer Mother   . Prostate cancer Father   . Breast cancer Sister    Social History  Substance Use Topics  . Smoking status: Never Smoker   . Smokeless tobacco: None  . Alcohol Use: No   OB History    No data available     Review of Systems 10/14 systems reviewed and are negative other than those stated in the HPI    Allergies   Tramadol  Home Medications   Prior to Admission medications   Medication Sig Start Date End Date Taking? Authorizing Provider  ibuprofen (ADVIL,MOTRIN) 600 MG tablet Take 1 tablet (600 mg total) by mouth 3 (three) times daily. 05/27/14   Max T Hyatt, DPM  Multiple Vitamin (MULTIVITAMIN) tablet Take 1 tablet by mouth daily.    Historical Provider, MD  naproxen sodium (ANAPROX) 220 MG tablet Take 220 mg by mouth at bedtime as needed (for pain.).     Historical Provider, MD   BP 127/90 mmHg  Pulse 70  Temp(Src) 99.4 F (37.4 C) (Oral)  Resp 18  Ht 5\' 6"  (1.676 m)  Wt 175 lb (79.379 kg)  BMI 28.26 kg/m2  SpO2 99%  LMP 10/15/2010 Physical Exam Physical Exam  Nursing note and vitals reviewed. Constitutional: Well developed, well nourished, non-toxic, and in no acute distress Head: Normocephalic and atraumatic.  Mouth/Throat: Oropharynx is clear and moist.  Neck: Normal range of motion. Neck supple.  Cardiovascular: Normal rate and regular rhythm.   Pulmonary/Chest: Effort normal and breath sounds normal.  Abdominal: Soft. There is no tenderness. There is no rebound and no guarding.  Musculoskeletal: Normal range of motion.  Neurological: Alert, no facial droop, fluent speech, moves all extremities symmetrically Skin: Skin is warm and dry.  Psychiatric: Cooperative  ED Course  Procedures (including critical care time) Labs Review Labs Reviewed  BASIC METABOLIC PANEL  CBC  I-STAT TROPOININ, ED    Imaging Review Dg Chest 2 View  03/14/2016  CLINICAL DATA:  Chest pain for 2 weeks with shortness of breath. EXAM: CHEST  2 VIEW COMPARISON:  09/20/2012 chest radiograph FINDINGS: The cardiomediastinal silhouette is unremarkable. There is no evidence of focal airspace disease, pulmonary edema, suspicious pulmonary nodule/mass, pleural effusion, or pneumothorax. No acute bony abnormalities are identified. A remote left fifth rib fracture is again noted. IMPRESSION: No active  cardiopulmonary disease. Electronically Signed   By: Margarette Canada M.D.   On: 03/14/2016 15:15   I have personally reviewed and evaluated these images and lab results as part of my medical decision-making.   EKG Interpretation   Date/Time:  Wednesday March 14 2016 14:19:50 EDT Ventricular Rate:  67 PR Interval:  188 QRS Duration: 88 QT Interval:  366 QTC Calculation: 386 R Axis:   36 Text Interpretation:  Normal sinus rhythm Normal ECG No change from prior  EKG  Confirmed by LIU MD, Hinton Dyer [id number]) on 03/14/2016 4:42:13 PM      MDM   Final diagnoses:  None   50 year old female who presents with intermittent chest pain. Well appearing with stable vital signs. EKG non-ischemic, serial troponin negative. CXR without acute cardiopulmonary processes. Patient refusing repeat EKG for eval for dynamic changes. Low risk chest pain with HEART Score of 2 for risk factors and age. Pain seems atypical for that of ACS. PERC negative. No concern for PE. Presentation currently not c/f dissection or other serious intrathoracic or cardiac causes. Discussed continued outpatient f/u with PCP and scheduled cardiology appointment. Strict return and follow-up instructions reviewed. She expressed understanding of all discharge instructions and felt comfortable with the plan of care.     Forde Dandy, MD 03/14/16 [phone]

## 2016-03-20 ENCOUNTER — Telehealth: Payer: Self-pay | Admitting: Cardiovascular Disease

## 2016-03-20 NOTE — Telephone Encounter (Signed)
Received records from [place] for appointment on 04/18/16 with Dr Gwenlyn Found.  Records given to [facility] (medical records) for Dr Kennon Holter schedule on 04/18/16. lp

## 2016-03-27 DIAGNOSIS — I10 Essential (primary) hypertension: Secondary | ICD-10-CM | POA: Diagnosis not present

## 2016-04-18 ENCOUNTER — Ambulatory Visit: Payer: BLUE CROSS/BLUE SHIELD | Admitting: Cardiovascular Disease

## 2016-04-18 DIAGNOSIS — R0989 Other specified symptoms and signs involving the circulatory and respiratory systems: Secondary | ICD-10-CM

## 2016-04-19 ENCOUNTER — Encounter: Payer: Self-pay | Admitting: *Deleted

## 2016-04-19 NOTE — Progress Notes (Signed)
Letter mailed

## 2016-04-24 ENCOUNTER — Ambulatory Visit: Payer: BLUE CROSS/BLUE SHIELD | Admitting: Cardiovascular Disease

## 2016-04-27 ENCOUNTER — Other Ambulatory Visit: Payer: Self-pay | Admitting: Rheumatology

## 2016-04-27 ENCOUNTER — Ambulatory Visit
Admission: RE | Admit: 2016-04-27 | Discharge: 2016-04-27 | Disposition: A | Discharge status: Home / Self Care | Payer: BLUE CROSS/BLUE SHIELD | Source: Ambulatory Visit | Attending: Rheumatology | Admitting: Rheumatology

## 2016-04-27 ENCOUNTER — Ambulatory Visit
Admission: RE | Admit: 2016-04-27 | Discharge: 2016-04-27 | Disposition: A | Discharge status: Home / Self Care | Payer: BLUE CROSS/BLUE SHIELD | Source: Ambulatory Visit

## 2016-04-27 DIAGNOSIS — Z1231 Encounter for screening mammogram for malignant neoplasm of breast: Secondary | ICD-10-CM | POA: Diagnosis not present

## 2016-04-27 DIAGNOSIS — M130 Polyarthritis, unspecified: Secondary | ICD-10-CM

## 2016-05-10 DIAGNOSIS — K921 Melena: Secondary | ICD-10-CM | POA: Diagnosis not present

## 2016-05-10 DIAGNOSIS — K219 Gastro-esophageal reflux disease without esophagitis: Secondary | ICD-10-CM | POA: Diagnosis not present

## 2016-05-10 DIAGNOSIS — K5900 Constipation, unspecified: Secondary | ICD-10-CM | POA: Diagnosis not present

## 2016-05-10 DIAGNOSIS — Z01818 Encounter for other preprocedural examination: Secondary | ICD-10-CM | POA: Diagnosis not present

## 2016-07-13 DIAGNOSIS — R002 Palpitations: Secondary | ICD-10-CM | POA: Diagnosis not present

## 2016-07-13 DIAGNOSIS — I10 Essential (primary) hypertension: Secondary | ICD-10-CM | POA: Diagnosis not present

## 2016-07-13 DIAGNOSIS — G43009 Migraine without aura, not intractable, without status migrainosus: Secondary | ICD-10-CM | POA: Diagnosis not present

## 2016-08-29 DIAGNOSIS — M064 Inflammatory polyarthropathy: Secondary | ICD-10-CM | POA: Diagnosis not present

## 2016-08-29 DIAGNOSIS — M25571 Pain in right ankle and joints of right foot: Secondary | ICD-10-CM | POA: Diagnosis not present

## 2016-08-29 DIAGNOSIS — M25572 Pain in left ankle and joints of left foot: Secondary | ICD-10-CM | POA: Diagnosis not present

## 2016-09-10 DIAGNOSIS — Z800 Family history of malignant neoplasm of digestive organs: Secondary | ICD-10-CM | POA: Diagnosis not present

## 2016-09-10 DIAGNOSIS — Z1211 Encounter for screening for malignant neoplasm of colon: Secondary | ICD-10-CM | POA: Diagnosis not present

## 2016-09-10 DIAGNOSIS — K5730 Diverticulosis of large intestine without perforation or abscess without bleeding: Secondary | ICD-10-CM | POA: Diagnosis not present

## 2016-11-23 DIAGNOSIS — G43909 Migraine, unspecified, not intractable, without status migrainosus: Secondary | ICD-10-CM | POA: Diagnosis not present

## 2016-11-23 DIAGNOSIS — R002 Palpitations: Secondary | ICD-10-CM | POA: Diagnosis not present

## 2016-11-23 DIAGNOSIS — I10 Essential (primary) hypertension: Secondary | ICD-10-CM | POA: Diagnosis not present

## 2017-01-15 ENCOUNTER — Telehealth: Payer: Self-pay | Admitting: Cardiovascular Disease

## 2017-01-15 NOTE — Telephone Encounter (Signed)
Received records from [place] for appointment on 01/29/17 with Dr Gwenlyn Found.  Records put with Dr Kennon Holter schedule for 01/29/17. lp

## 2017-01-25 DIAGNOSIS — G44229 Chronic tension-type headache, not intractable: Secondary | ICD-10-CM | POA: Diagnosis not present

## 2017-01-25 DIAGNOSIS — M542 Cervicalgia: Secondary | ICD-10-CM | POA: Diagnosis not present

## 2017-01-29 ENCOUNTER — Ambulatory Visit: Payer: BLUE CROSS/BLUE SHIELD | Admitting: Cardiovascular Disease

## 2017-01-30 ENCOUNTER — Encounter: Payer: Self-pay | Admitting: *Deleted

## 2017-01-31 DIAGNOSIS — R350 Frequency of micturition: Secondary | ICD-10-CM | POA: Diagnosis not present

## 2017-01-31 DIAGNOSIS — Z01419 Encounter for gynecological examination (general) (routine) without abnormal findings: Secondary | ICD-10-CM | POA: Diagnosis not present

## 2017-01-31 DIAGNOSIS — G4700 Insomnia, unspecified: Secondary | ICD-10-CM | POA: Diagnosis not present

## 2017-01-31 DIAGNOSIS — N952 Postmenopausal atrophic vaginitis: Secondary | ICD-10-CM | POA: Diagnosis not present

## 2017-02-12 ENCOUNTER — Ambulatory Visit
Admission: RE | Admit: 2017-02-12 | Discharge: 2017-02-12 | Disposition: A | Discharge status: Home / Self Care | Payer: BLUE CROSS/BLUE SHIELD | Source: Ambulatory Visit | Attending: Family Medicine | Admitting: Family Medicine

## 2017-02-12 ENCOUNTER — Other Ambulatory Visit: Payer: Self-pay | Admitting: Family Medicine

## 2017-02-12 DIAGNOSIS — M542 Cervicalgia: Secondary | ICD-10-CM | POA: Diagnosis not present

## 2017-06-14 ENCOUNTER — Other Ambulatory Visit: Payer: Self-pay | Admitting: Family Medicine

## 2017-06-14 DIAGNOSIS — Z1231 Encounter for screening mammogram for malignant neoplasm of breast: Secondary | ICD-10-CM

## 2017-07-08 ENCOUNTER — Ambulatory Visit
Admission: RE | Admit: 2017-07-08 | Discharge: 2017-07-08 | Disposition: A | Discharge status: Home / Self Care | Payer: BLUE CROSS/BLUE SHIELD | Source: Ambulatory Visit | Attending: Family Medicine | Admitting: Family Medicine

## 2017-07-08 DIAGNOSIS — Z1231 Encounter for screening mammogram for malignant neoplasm of breast: Secondary | ICD-10-CM | POA: Diagnosis not present

## 2017-11-15 DIAGNOSIS — M2550 Pain in unspecified joint: Secondary | ICD-10-CM | POA: Diagnosis not present

## 2017-11-15 DIAGNOSIS — I10 Essential (primary) hypertension: Secondary | ICD-10-CM | POA: Diagnosis not present

## 2017-11-15 DIAGNOSIS — Z79899 Other long term (current) drug therapy: Secondary | ICD-10-CM | POA: Diagnosis not present

## 2017-11-15 DIAGNOSIS — G43909 Migraine, unspecified, not intractable, without status migrainosus: Secondary | ICD-10-CM | POA: Diagnosis not present

## 2017-11-18 ENCOUNTER — Other Ambulatory Visit: Payer: Self-pay | Admitting: Family Medicine

## 2017-11-18 DIAGNOSIS — G43909 Migraine, unspecified, not intractable, without status migrainosus: Secondary | ICD-10-CM

## 2017-12-06 ENCOUNTER — Other Ambulatory Visit: Payer: BLUE CROSS/BLUE SHIELD

## 2017-12-13 ENCOUNTER — Ambulatory Visit
Admission: RE | Admit: 2017-12-13 | Discharge: 2017-12-13 | Disposition: A | Discharge status: Home / Self Care | Payer: BLUE CROSS/BLUE SHIELD | Source: Ambulatory Visit | Attending: Family Medicine | Admitting: Family Medicine

## 2017-12-13 DIAGNOSIS — G43909 Migraine, unspecified, not intractable, without status migrainosus: Secondary | ICD-10-CM | POA: Diagnosis not present

## 2017-12-30 ENCOUNTER — Other Ambulatory Visit: Payer: Self-pay | Admitting: Family Medicine

## 2017-12-30 DIAGNOSIS — Z1231 Encounter for screening mammogram for malignant neoplasm of breast: Secondary | ICD-10-CM

## 2018-01-10 DIAGNOSIS — R6884 Jaw pain: Secondary | ICD-10-CM | POA: Diagnosis not present

## 2018-01-17 DIAGNOSIS — H43811 Vitreous degeneration, right eye: Secondary | ICD-10-CM | POA: Diagnosis not present

## 2018-01-17 DIAGNOSIS — D3131 Benign neoplasm of right choroid: Secondary | ICD-10-CM | POA: Diagnosis not present

## 2018-01-17 DIAGNOSIS — H2513 Age-related nuclear cataract, bilateral: Secondary | ICD-10-CM | POA: Diagnosis not present

## 2018-02-21 ENCOUNTER — Encounter: Payer: Self-pay | Admitting: *Deleted

## 2018-02-21 DIAGNOSIS — G44229 Chronic tension-type headache, not intractable: Secondary | ICD-10-CM

## 2018-02-21 DIAGNOSIS — I10 Essential (primary) hypertension: Secondary | ICD-10-CM | POA: Insufficient documentation

## 2018-02-21 DIAGNOSIS — K921 Melena: Secondary | ICD-10-CM | POA: Insufficient documentation

## 2018-02-21 DIAGNOSIS — N952 Postmenopausal atrophic vaginitis: Secondary | ICD-10-CM | POA: Insufficient documentation

## 2018-02-21 DIAGNOSIS — K5900 Constipation, unspecified: Secondary | ICD-10-CM

## 2018-02-21 DIAGNOSIS — G4700 Insomnia, unspecified: Secondary | ICD-10-CM

## 2018-02-21 DIAGNOSIS — G43009 Migraine without aura, not intractable, without status migrainosus: Secondary | ICD-10-CM

## 2018-02-21 DIAGNOSIS — G43909 Migraine, unspecified, not intractable, without status migrainosus: Secondary | ICD-10-CM | POA: Insufficient documentation

## 2018-02-21 DIAGNOSIS — K219 Gastro-esophageal reflux disease without esophagitis: Secondary | ICD-10-CM

## 2018-02-21 HISTORY — DX: Constipation, unspecified: K59.00

## 2018-02-21 HISTORY — DX: Chronic tension-type headache, not intractable: G44.229

## 2018-02-21 HISTORY — DX: Hypercalcemia: E83.52

## 2018-02-21 HISTORY — DX: Insomnia, unspecified: G47.00

## 2018-02-21 HISTORY — DX: Migraine without aura, not intractable, without status migrainosus: G43.009

## 2018-02-21 HISTORY — DX: Postmenopausal atrophic vaginitis: N95.2

## 2018-02-21 HISTORY — DX: Gastro-esophageal reflux disease without esophagitis: K21.9

## 2018-02-24 ENCOUNTER — Telehealth: Payer: Self-pay | Admitting: Cardiovascular Disease

## 2018-02-24 NOTE — Telephone Encounter (Signed)
Received records from [facility] on 02/24/18, Appt 03/04/18 @ 8:15 AM. [place]

## 2018-03-04 ENCOUNTER — Ambulatory Visit: Payer: BLUE CROSS/BLUE SHIELD | Admitting: Cardiovascular Disease

## 2018-03-05 ENCOUNTER — Encounter: Payer: Self-pay | Admitting: *Deleted

## 2018-03-28 DIAGNOSIS — M064 Inflammatory polyarthropathy: Secondary | ICD-10-CM | POA: Diagnosis not present

## 2018-04-08 DIAGNOSIS — J343 Hypertrophy of nasal turbinates: Secondary | ICD-10-CM | POA: Diagnosis not present

## 2018-04-08 DIAGNOSIS — K118 Other diseases of salivary glands: Secondary | ICD-10-CM | POA: Diagnosis not present

## 2018-04-25 DIAGNOSIS — Z79899 Other long term (current) drug therapy: Secondary | ICD-10-CM | POA: Diagnosis not present

## 2018-06-24 DIAGNOSIS — M064 Inflammatory polyarthropathy: Secondary | ICD-10-CM | POA: Diagnosis not present

## 2018-06-24 DIAGNOSIS — M25572 Pain in left ankle and joints of left foot: Secondary | ICD-10-CM | POA: Diagnosis not present

## 2018-06-24 DIAGNOSIS — M79642 Pain in left hand: Secondary | ICD-10-CM | POA: Diagnosis not present

## 2018-06-24 DIAGNOSIS — M79641 Pain in right hand: Secondary | ICD-10-CM | POA: Diagnosis not present

## 2018-07-10 ENCOUNTER — Ambulatory Visit
Admission: RE | Admit: 2018-07-10 | Discharge: 2018-07-10 | Disposition: A | Discharge status: Home / Self Care | Payer: BLUE CROSS/BLUE SHIELD | Source: Ambulatory Visit | Attending: Family Medicine | Admitting: Family Medicine

## 2018-07-10 DIAGNOSIS — Z1231 Encounter for screening mammogram for malignant neoplasm of breast: Secondary | ICD-10-CM | POA: Diagnosis not present

## 2018-08-02 IMAGING — MR MR HEAD W/O CM
10 series · 48 of 48 positions shown · non-contrast
Comparison: None.

CLINICAL DATA: Migraine headaches. Palpable nodule on the left ear.
Migraine without status migrainosus, not intractable, unspecified
migraine-type.

EXAM:
MRI HEAD WITHOUT CONTRAST
TECHNIQUE: Multiplanar, multiecho pulse sequences of the brain and surrounding
structures were obtained without intravenous contrast.

[Series 2: T1 · sagittal · 5.0mm · 0.47mm/px · 3 of 26 slices shown]
[im 1/26]
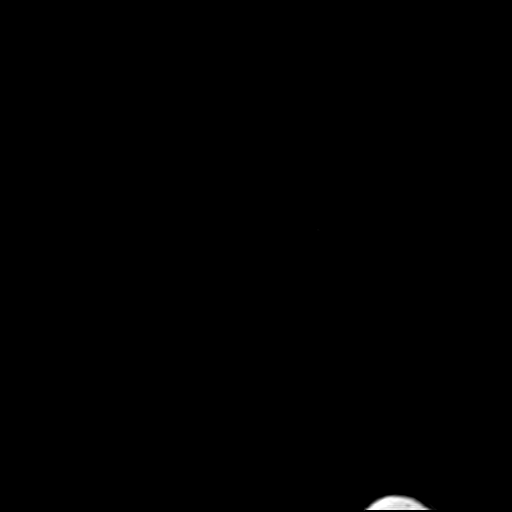
[im 13/26]
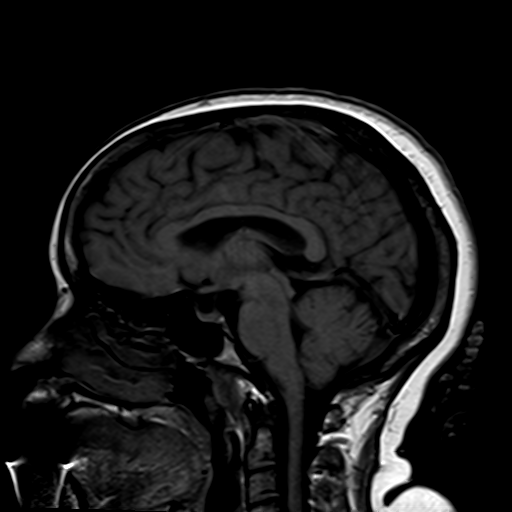
[im 26/26]
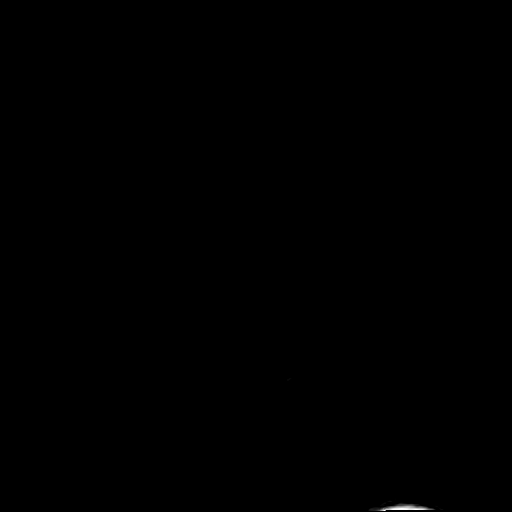

[Series 3: DWI · axial · 3.0mm · 1.88mm/px · z∈[-18,+135]mm · 8 of 102 slices shown (1 of 4)]
[im 1/102]
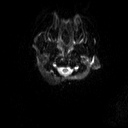
[im 15/102]
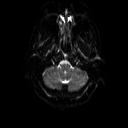
[im 29/102]
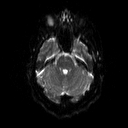
[im 44/102]
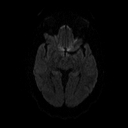
[im 58/102]
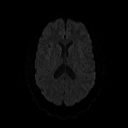
[im 73/102]
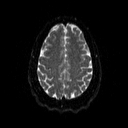
[im 87/102]
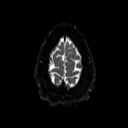
[im 102/102]
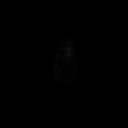

[Series 4: DWI · axial · 3.0mm · 1.88mm/px · z∈[-24,+135]mm · 4 of 54 slices shown (2 of 4)]
[im 1/54]
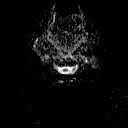
[im 18/54]
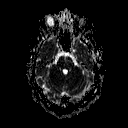
[im 36/54]
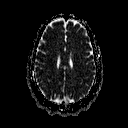
[im 54/54]
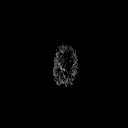

[Series 5: DWI · coronal · 5.0mm · 1.80mm/px · 6 of 73 slices shown (3 of 4)]
[im 1/73]
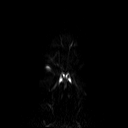
[im 15/73]
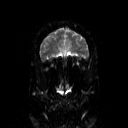
[im 29/73]
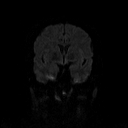
[im 44/73]
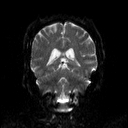
[im 58/73]
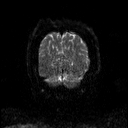
[im 73/73]
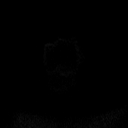

[Series 6: DWI · coronal · 5.0mm · 1.80mm/px · 3 of 38 slices shown (4 of 4)]
[im 1/38]
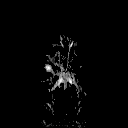
[im 19/38]
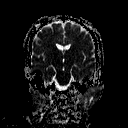
[im 38/38]
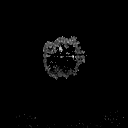

[Series 7: FLAIR · axial · 3.0mm · 0.47mm/px · z∈[-19,+140]mm · 3 of 35 slices shown]
[im 1/35]
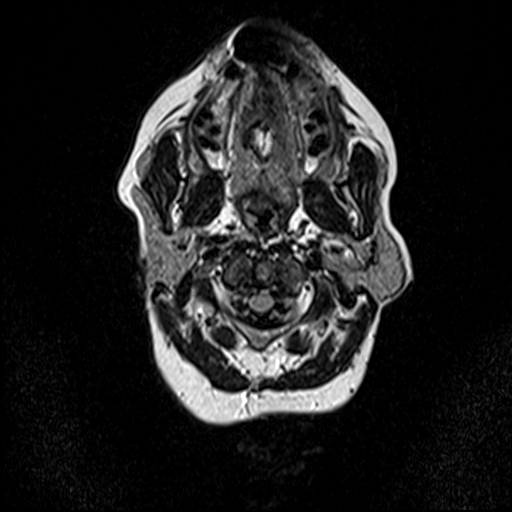
[im 18/35]
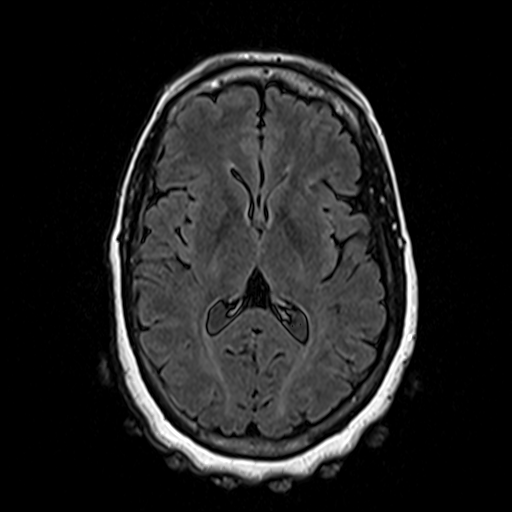
[im 35/35]
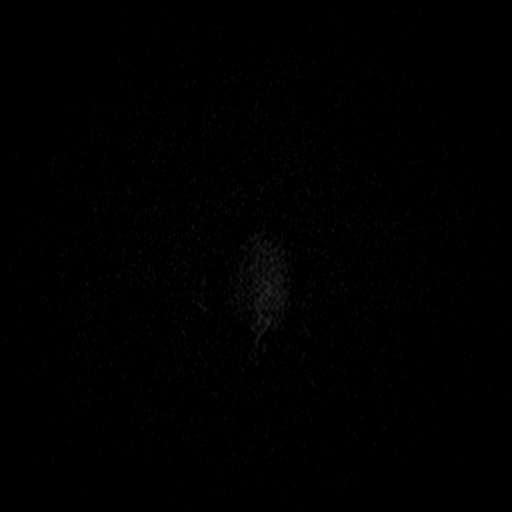

[Series 8: T2 · axial · 5.0mm · 0.62mm/px · z∈[-25,+137]mm · 2 of 25 slices shown (1 of 2)]
[im 1/25]
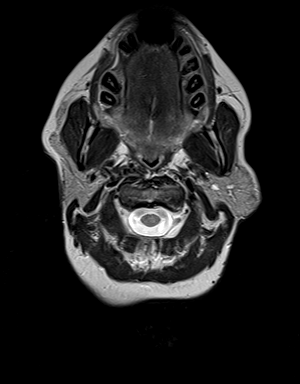
[im 25/25]
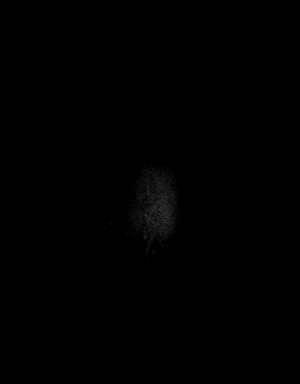

[Series 10: swi_images · axial · 5.0mm · 0.94mm/px · z∈[-17,+137]mm · 3 of 32 slices shown]
[im 1/32]
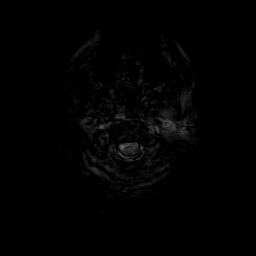
[im 16/32]
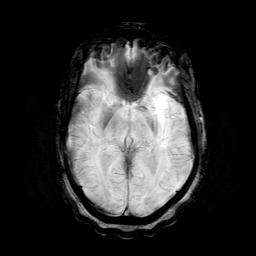
[im 32/32]
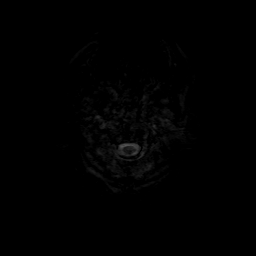

[Series 11: t1_mpr_tra · axial · 1.0mm · 0.75mm/px · z∈[-27,+132]mm · 13 of 160 slices shown]
[im 1/160]
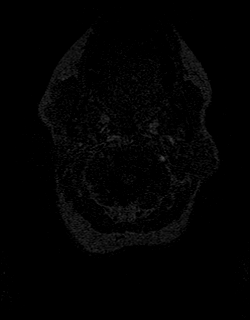
[im 14/160]
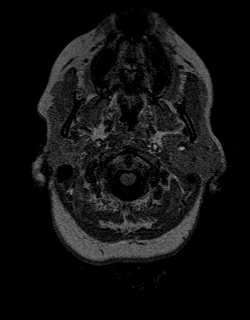
[im 27/160]
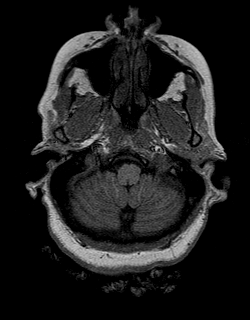
[im 40/160]
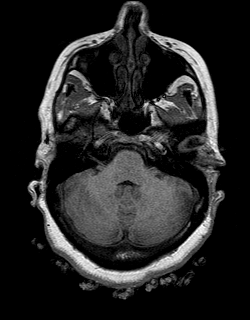
[im 54/160]
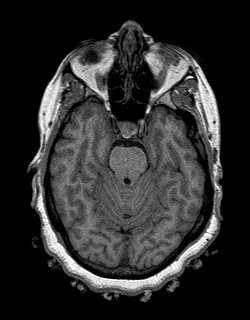
[im 67/160]
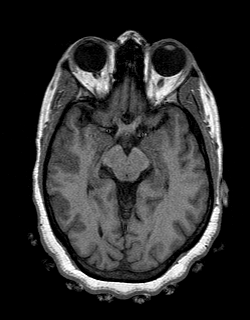
[im 80/160]
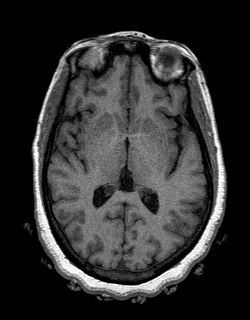
[im 93/160]
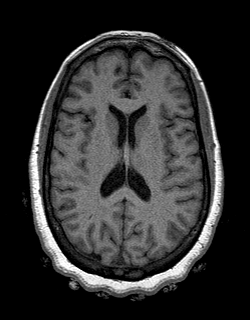
[im 107/160]
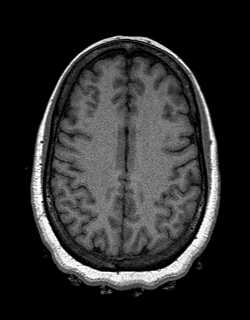
[im 120/160]
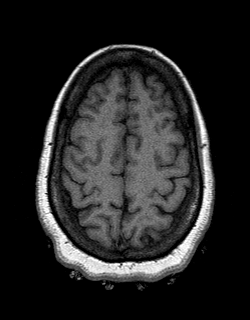
[im 133/160]
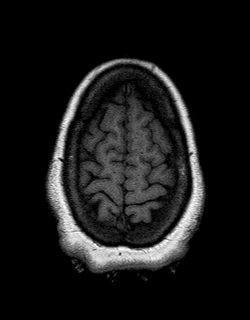
[im 146/160]
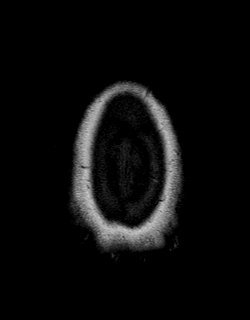
[im 160/160]
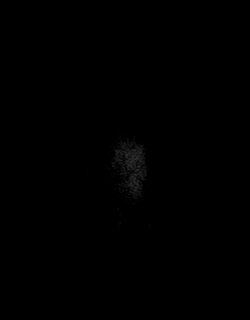

[Series 12: T2 · coronal · 5.0mm · 0.45mm/px · 3 of 32 slices shown (2 of 2)]
[im 1/32]
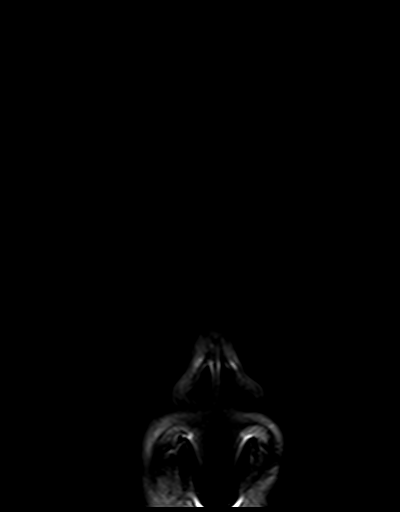
[im 16/32]
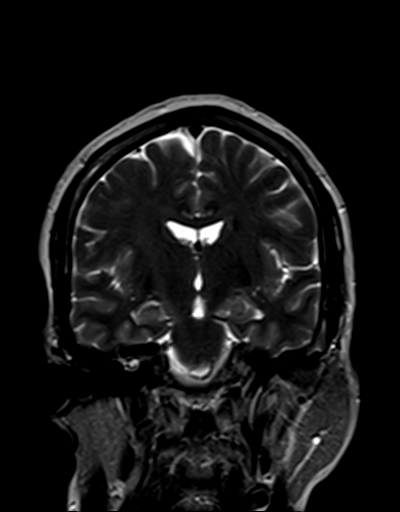
[im 32/32]
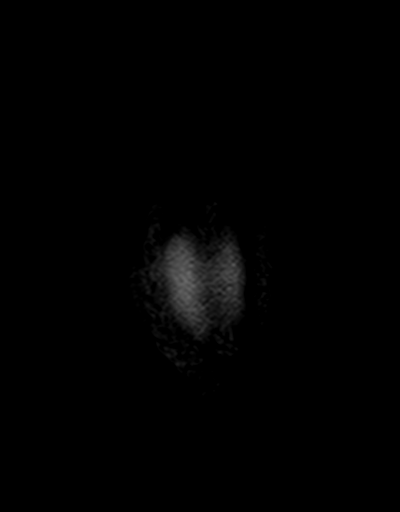

[48 of 48 positions shown; findings below may reference images not displayed]

FINDINGS: Brain: The diffusion images demonstrate no acute or subacute
infarction. No acute hemorrhage or mass lesion is present. There is
no significant white matter disease. Ventricles are of normal size.
No significant extra-axial fluid collection is present. The internal
auditory canals are within normal limits. The brainstem and
cerebellum are normal.

Vascular: Flow is present in the major intracranial arteries. Fetal
type posterior cerebral arteries are present bilaterally.

Skull and upper cervical spine: The skull base is within normal
limits. The craniocervical junction is normal.

Sinuses/Orbits: The paranasal sinuses and mastoid air cells are
clear. Globes and orbits are within normal limits.

Other: Negative
IMPRESSION: Negative MRI of the brain. No acute or focal lesion to explain
headaches.

## 2018-11-03 DIAGNOSIS — M130 Polyarthritis, unspecified: Secondary | ICD-10-CM | POA: Diagnosis not present

## 2018-11-03 DIAGNOSIS — I10 Essential (primary) hypertension: Secondary | ICD-10-CM | POA: Diagnosis not present

## 2018-11-03 DIAGNOSIS — G44229 Chronic tension-type headache, not intractable: Secondary | ICD-10-CM | POA: Diagnosis not present

## 2019-06-16 ENCOUNTER — Other Ambulatory Visit: Payer: Self-pay | Admitting: Family Medicine

## 2019-06-16 DIAGNOSIS — Z1231 Encounter for screening mammogram for malignant neoplasm of breast: Secondary | ICD-10-CM

## 2019-06-29 DIAGNOSIS — H43813 Vitreous degeneration, bilateral: Secondary | ICD-10-CM | POA: Diagnosis not present

## 2019-06-29 DIAGNOSIS — D3131 Benign neoplasm of right choroid: Secondary | ICD-10-CM | POA: Diagnosis not present

## 2019-06-29 DIAGNOSIS — H35033 Hypertensive retinopathy, bilateral: Secondary | ICD-10-CM | POA: Diagnosis not present

## 2019-06-29 DIAGNOSIS — H35363 Drusen (degenerative) of macula, bilateral: Secondary | ICD-10-CM | POA: Diagnosis not present

## 2019-08-07 ENCOUNTER — Ambulatory Visit: Payer: BLUE CROSS/BLUE SHIELD

## 2019-08-11 DIAGNOSIS — H33312 Horseshoe tear of retina without detachment, left eye: Secondary | ICD-10-CM | POA: Diagnosis not present

## 2019-08-11 DIAGNOSIS — H35363 Drusen (degenerative) of macula, bilateral: Secondary | ICD-10-CM | POA: Diagnosis not present

## 2019-08-11 DIAGNOSIS — H43392 Other vitreous opacities, left eye: Secondary | ICD-10-CM | POA: Diagnosis not present

## 2019-08-11 DIAGNOSIS — H43812 Vitreous degeneration, left eye: Secondary | ICD-10-CM | POA: Diagnosis not present

## 2019-08-19 DIAGNOSIS — H33312 Horseshoe tear of retina without detachment, left eye: Secondary | ICD-10-CM | POA: Diagnosis not present

## 2019-08-19 DIAGNOSIS — H43392 Other vitreous opacities, left eye: Secondary | ICD-10-CM | POA: Diagnosis not present

## 2019-08-20 ENCOUNTER — Other Ambulatory Visit: Payer: Self-pay

## 2019-08-20 ENCOUNTER — Ambulatory Visit
Admission: RE | Admit: 2019-08-20 | Discharge: 2019-08-20 | Disposition: A | Discharge status: Home / Self Care | Payer: BLUE CROSS/BLUE SHIELD | Source: Ambulatory Visit | Attending: Family Medicine | Admitting: Family Medicine

## 2019-08-20 DIAGNOSIS — Z1231 Encounter for screening mammogram for malignant neoplasm of breast: Secondary | ICD-10-CM | POA: Diagnosis not present

## 2019-08-25 DIAGNOSIS — Z13220 Encounter for screening for lipoid disorders: Secondary | ICD-10-CM | POA: Diagnosis not present

## 2019-08-25 DIAGNOSIS — M130 Polyarthritis, unspecified: Secondary | ICD-10-CM | POA: Diagnosis not present

## 2019-08-25 DIAGNOSIS — I10 Essential (primary) hypertension: Secondary | ICD-10-CM | POA: Diagnosis not present

## 2019-08-25 DIAGNOSIS — G43909 Migraine, unspecified, not intractable, without status migrainosus: Secondary | ICD-10-CM | POA: Diagnosis not present

## 2019-09-10 DIAGNOSIS — Z13220 Encounter for screening for lipoid disorders: Secondary | ICD-10-CM | POA: Diagnosis not present

## 2019-09-10 DIAGNOSIS — I10 Essential (primary) hypertension: Secondary | ICD-10-CM | POA: Diagnosis not present

## 2020-03-21 DIAGNOSIS — M62838 Other muscle spasm: Secondary | ICD-10-CM | POA: Diagnosis not present

## 2020-03-21 DIAGNOSIS — S99921A Unspecified injury of right foot, initial encounter: Secondary | ICD-10-CM | POA: Diagnosis not present

## 2020-03-21 DIAGNOSIS — N644 Mastodynia: Secondary | ICD-10-CM | POA: Diagnosis not present

## 2020-03-23 ENCOUNTER — Other Ambulatory Visit: Payer: Self-pay | Admitting: Family Medicine

## 2020-03-23 DIAGNOSIS — N644 Mastodynia: Secondary | ICD-10-CM

## 2020-03-28 DIAGNOSIS — Z20822 Contact with and (suspected) exposure to covid-19: Secondary | ICD-10-CM | POA: Diagnosis not present

## 2020-03-28 DIAGNOSIS — Z03818 Encounter for observation for suspected exposure to other biological agents ruled out: Secondary | ICD-10-CM | POA: Diagnosis not present

## 2020-04-08 ENCOUNTER — Ambulatory Visit
Admission: EM | Admit: 2020-04-08 | Discharge: 2020-04-08 | Disposition: A | Discharge status: Home / Self Care | Payer: Self-pay | Attending: Physician Assistant | Admitting: Physician Assistant

## 2020-04-08 ENCOUNTER — Other Ambulatory Visit: Payer: BC Managed Care – PPO

## 2020-04-08 DIAGNOSIS — L559 Sunburn, unspecified: Secondary | ICD-10-CM

## 2020-04-08 DIAGNOSIS — R238 Other skin changes: Secondary | ICD-10-CM

## 2020-04-08 MED ORDER — SILVER SULFADIAZINE 1 % EX CREA: 1.0000 "application " | TOPICAL_CREAM | Freq: Every day | CUTANEOUS | 0 refills | Status: DC

## 2020-04-08 NOTE — ED Provider Notes (Signed)
[facility]    CSN: [id number] Arrival date & time: 04/08/20  1431      History   Chief Complaint Chief Complaint  Patient presents with  . Rash  . Sunburn    HPI Linda Nelson is a 54 y.o. female.   55 year old female comes in for vesicular rash after being sunburned yesterday. States had itching last night, had some rash consistent with sunburn at first. However, went outside to cut grass, and now with diffuse vesicular rash to the upper back, BUE, chest, face. Itching has resolved. No pain. Did use a cream/lotion to help tan while on vacation.      Past Medical History:  Diagnosis Date  . Abdominal pain   . Atrophic vaginitis 02/21/2018  . Bronchitis, acute 09/11/2006  . Chronic frontal sinusitis 09/11/2006  . Chronic tension-type headache 02/21/2018  . Colon cancer screening 06/17/2008   Overview:  ICD10 Conversion  . Constipation 02/21/2018  . Dizziness   . Family history of malignant neoplasm of gastrointestinal tract 06/17/2008  . Foot pain 01/22/2014  . Gastroesophageal reflux disease 02/21/2018  . Hemorrhoids   . Hypercalcemia 02/21/2018  . Hypertension   . Insomnia 02/21/2018  . Migraine    headaches more frequent since surgery   . Migraine without aura, not refractory 02/21/2018  . Seborrheic keratosis 05/15/2007   Overview:  IMO Load 2016 R1.3  . Synovitis of ankle 06/29/2014  . Uterine fibroid     Patient Active Problem List   Diagnosis Date Noted  . Atrophic vaginitis 02/21/2018  . Chronic tension-type headache 02/21/2018  . Constipation 02/21/2018  . Gastroesophageal reflux disease 02/21/2018  . Hematochezia 02/21/2018  . Hypercalcemia 02/21/2018  . Insomnia 02/21/2018  . Migraine without aura, not refractory 02/21/2018  . Migraine 02/21/2018  . Hypertension 02/21/2018  . Benign hypertension 01/07/2016  . Synovitis of ankle 06/29/2014  . Pain in joint involving ankle and foot 06/29/2014  . Foot pain 01/22/2014  . Abdominal pain,  other specified site 06/17/2008  . Colon cancer screening 06/17/2008  . Family history of malignant neoplasm of gastrointestinal tract 06/17/2008  . Rectal bleeding 06/17/2008  . Seborrheic keratosis 05/15/2007  . Hemorrhoids 05/01/2007  . Bronchitis, acute 09/11/2006  . Chronic frontal sinusitis 09/11/2006  . Migraine headache 09/11/2006    Past Surgical History:  Procedure Laterality Date  . CESAREAN SECTION    . HEMORROIDECTOMY     Dr Alessandra Bevels   . TOTAL ABDOMINAL HYSTERECTOMY W/ BILATERAL SALPINGOOPHORECTOMY  2012  . VAGINAL PROLAPSE REPAIR  03/28/2011   Procedure: VAGINAL VAULT SUSPENSION;  Surgeon: Princess Bruins;  Location: [facility];  Service: Gynecology;  Laterality: N/A;  Repair of Vaginal Cuff    OB History   No obstetric history on file.      Home Medications    Prior to Admission medications   Medication Sig Start Date End Date Taking? Authorizing Provider  ibuprofen (ADVIL,MOTRIN) 600 MG tablet Take 1 tablet (600 mg total) by mouth 3 (three) times daily. 05/27/14  Yes Hyatt, Max T, DPM  irbesartan (AVAPRO) 150 MG tablet Take 150 mg by mouth daily.   Yes [provider]  Multiple Vitamin (MULTIVITAMIN) tablet Take 1 tablet by mouth daily.   Yes [provider]  meloxicam (MOBIC) 15 MG tablet Take 15 mg by mouth daily. 10/26/15   [provider]  naproxen sodium (ANAPROX) 220 MG tablet Take 220 mg by mouth at bedtime as needed (for pain.).     [provider]  silver sulfADIAZINE (SILVADENE) 1 % cream Apply 1 application topically daily. 04/08/20   Ok Edwards, PA-C    Family History Family History  Problem Relation Age of Onset  . Cervical cancer Mother   . Lung cancer Mother   . Breast cancer Mother   . Prostate cancer Father   . Colon cancer Father   . Breast cancer Sister 71       53  . Breast cancer Sister 42  . Hypertension Other   . Prostate cancer Other   . Uterine cancer Other     Social History Social History    Tobacco Use  . Smoking status: Never Smoker  Substance Use Topics  . Alcohol use: No  . Drug use: No     Allergies   Sumatriptan succinate, Tramadol, and Tramadol hcl   Review of Systems Review of Systems  Reason unable to perform ROS: See HPI as above.     Physical Exam Triage Vital Signs ED Triage Vitals  Enc Vitals Group     BP 04/08/20 1533 (!) 160/88     Pulse Rate 04/08/20 1533 80     Resp 04/08/20 1533 18     Temp 04/08/20 1533 98.8 F (37.1 C)     Temp Source 04/08/20 1533 Oral     SpO2 04/08/20 1533 98 %     Weight --      Height --      Head Circumference --      Peak Flow --      Pain Score 04/08/20 1532 0     Pain Loc --      Pain Edu? --      Excl. in [place]? --    No data found.  Updated Vital Signs BP (!) 160/88 (BP Location: Left Arm)   Pulse 80   Temp 98.8 F (37.1 C) (Oral)   Resp 18   LMP 10/15/2010   SpO2 98%   Physical Exam Constitutional:      General: She is not in acute distress.    Appearance: Normal appearance. She is well-developed. She is not toxic-appearing or diaphoretic.  HENT:     Head: Normocephalic and atraumatic.  Eyes:     Conjunctiva/sclera: Conjunctivae normal.     Pupils: Pupils are equal, round, and reactive to light.  Pulmonary:     Effort: Pulmonary effort is normal. No respiratory distress.  Musculoskeletal:     Cervical back: Normal range of motion and neck supple.  Skin:    General: Skin is warm and dry.     Comments: Vesicular rash to bilateral upper arm, chest, upper back, face. Few bulla to the back. One self erupted bulla to the right chest. No erythema, warmth. No tenderness.   Neurological:     Mental Status: She is alert and oriented to person, place, and time.      UC Treatments / Results  Labs (all labs ordered are listed, but only abnormal results are displayed) Labs Reviewed - No data to display  EKG   Radiology No results found.  Procedures Procedures (including critical care  time)  Medications Ordered in UC Medications - No data to display  Initial Impression / Assessment and Plan / UC Course  I have reviewed the triage vital signs and the nursing notes.  Pertinent labs & imaging results that were available during my care of the patient were reviewed by me and considered in my medical decision making (see chart  for details).    Discussed symptomatic treatment. Silvadene for open skin. Wound care instructions given. Return precautions given. Patient expresses understanding and agrees to plan.  Final Clinical Impressions(s) / UC Diagnoses   Final diagnoses:  Sunburn  Vesicular rash    ED Prescriptions    Medication Sig Dispense Auth. Provider   silver sulfADIAZINE (SILVADENE) 1 % cream Apply 1 application topically daily. 50 g Ok Edwards, PA-C     PDMP not reviewed this encounter.   Ok Edwards, PA-C 04/08/20 1627

## 2020-04-08 NOTE — Discharge Instructions (Signed)
Get aloe gel to help with irritation, can put in fridge. Silvadene on open areas. Monitor for spreading redness, warmth, pain, pus like drainage, follow up for reevaluation

## 2020-04-08 NOTE — ED Triage Notes (Signed)
Pt suffered sunburn yesterday had itching last night after being on vacation. Pt states she used cream/lotion to help her tan while on vacation. Pt c/o diffuse rash with blisters to face, arms, chest onset today after being outside cutting grass. Denies SOB, difficulty swallowing. Took 50mg  benadryl and applied hydrocortisone cream last night.

## 2020-04-29 ENCOUNTER — Ambulatory Visit
Admission: RE | Admit: 2020-04-29 | Discharge: 2020-04-29 | Disposition: A | Discharge status: Home / Self Care | Payer: BLUE CROSS/BLUE SHIELD | Source: Ambulatory Visit | Attending: Family Medicine | Admitting: Family Medicine

## 2020-04-29 ENCOUNTER — Ambulatory Visit: Payer: Self-pay

## 2020-04-29 ENCOUNTER — Other Ambulatory Visit: Payer: Self-pay

## 2020-04-29 DIAGNOSIS — N644 Mastodynia: Secondary | ICD-10-CM

## 2020-04-29 DIAGNOSIS — R928 Other abnormal and inconclusive findings on diagnostic imaging of breast: Secondary | ICD-10-CM | POA: Diagnosis not present

## 2020-09-30 ENCOUNTER — Other Ambulatory Visit: Payer: Self-pay | Admitting: Obstetrics & Gynecology

## 2020-09-30 ENCOUNTER — Other Ambulatory Visit: Payer: Self-pay

## 2020-09-30 ENCOUNTER — Ambulatory Visit
Admission: RE | Admit: 2020-09-30 | Discharge: 2020-09-30 | Disposition: A | Discharge status: Home / Self Care | Payer: Self-pay | Source: Ambulatory Visit | Attending: Obstetrics & Gynecology | Admitting: Obstetrics & Gynecology

## 2020-09-30 DIAGNOSIS — Z1231 Encounter for screening mammogram for malignant neoplasm of breast: Secondary | ICD-10-CM

## 2021-08-04 ENCOUNTER — Ambulatory Visit (INDEPENDENT_AMBULATORY_CARE_PROVIDER_SITE_OTHER): Payer: No Typology Code available for payment source | Admitting: Cardiovascular Disease

## 2021-08-04 ENCOUNTER — Encounter: Payer: Self-pay | Admitting: Cardiovascular Disease

## 2021-08-04 ENCOUNTER — Ambulatory Visit (INDEPENDENT_AMBULATORY_CARE_PROVIDER_SITE_OTHER): Payer: No Typology Code available for payment source

## 2021-08-04 ENCOUNTER — Other Ambulatory Visit: Payer: Self-pay

## 2021-08-04 VITALS — BP 128/86 | HR 70 | Ht 66.0 in | Wt 186.4 lb

## 2021-08-04 DIAGNOSIS — R002 Palpitations: Secondary | ICD-10-CM

## 2021-08-04 NOTE — Patient Instructions (Signed)
Medication Instructions:  No changes *If you need a refill on your cardiac medications before your next appointment, please call your pharmacy*   Lab Work: None ordered If you have labs (blood work) drawn today and your tests are completely normal, you will receive your results only by: [place] (if you have MyChart) OR A paper copy in the mail If you have any lab test that is abnormal or we need to change your treatment, we will call you to review the results.   Testing/Procedures: Your physician has requested that you have an echocardiogram. Echocardiography is a painless test that uses sound waves to create images of your heart. It provides your doctor with information about the size and shape of your heart and how well your heart's chambers and valves are working. This procedure takes approximately one hour. There are no restrictions for this procedure.  Zio 14 day monitor - applied today    Follow-Up: At [facility], you and your health needs are our priority.  As part of our continuing mission to provide you with exceptional heart care, we have created designated Provider Care Teams.  These Care Teams include your primary Cardiologist (physician) and Advanced Practice Providers (APPs -  Physician Assistants and Nurse Practitioners) who all work together to provide you with the care you need, when you need it.  We recommend signing up for the patient portal called "MyChart".  Sign up information is provided on this After Visit Summary.  MyChart is used to connect with patients for Virtual Visits (Telemedicine).  Patients are able to view lab/test results, encounter notes, upcoming appointments, etc.  Non-urgent messages can be sent to your provider as well.   To learn more about what you can do with MyChart, go to [email or web address].    Your next appointment:   4 week(s)  The format for your next appointment:   In Person  Provider:   Lauree Chandler, MD or  Advanced Practice Provider (NP/PA)    Other Instructions

## 2021-08-04 NOTE — Progress Notes (Unsigned)
Applied a 14 day Zio XT to patient in the office

## 2021-08-04 NOTE — Progress Notes (Signed)
Chief Complaint  Patient presents with   New Patient (Initial Visit)    palpitations   History of Present Illness: 55 yo female with history of HTN, GERD and migraine headaches who is here today as a new consult, referred by Dr. Dorthy Cooler, for the evaluation of palpitations. She has no prior heart issues. She has been feeling her heart fluttering. Her heart rate seems to be high at times. She is aware of her heart beating and pounding. This seems to worsen when she exercises. She has no chest pain or dyspnea when exercising. TSH normal in April 2022. She feels well overall.   Primary Care Physician: Lujean Amel, MD   Past Medical History:  Diagnosis Date   Abdominal pain    Atrophic vaginitis 02/21/2018   Bronchitis, acute 09/11/2006   Chronic frontal sinusitis 09/11/2006   Chronic tension-type headache 02/21/2018   Colon cancer screening 06/17/2008   Overview:  ICD10 Conversion   Constipation 02/21/2018   Dizziness    Family history of malignant neoplasm of gastrointestinal tract 06/17/2008   Foot pain 01/22/2014   Gastroesophageal reflux disease 02/21/2018   Hemorrhoids    Hypercalcemia 02/21/2018   Hypertension    Insomnia 02/21/2018   Migraine    headaches more frequent since surgery    Migraine without aura, not refractory 02/21/2018   Seborrheic keratosis 05/15/2007   Overview:  IMO Load 2016 R1.3   Synovitis of ankle 06/29/2014   Uterine fibroid     Past Surgical History:  Procedure Laterality Date   CESAREAN SECTION     HEMORROIDECTOMY     Dr Alessandra Bevels    TOTAL ABDOMINAL HYSTERECTOMY W/ BILATERAL SALPINGOOPHORECTOMY  2012   VAGINAL PROLAPSE REPAIR  03/28/2011   Procedure: VAGINAL VAULT SUSPENSION;  Surgeon: Princess Bruins;  Location: [facility];  Service: Gynecology;  Laterality: N/A;  Repair of Vaginal Cuff    Current Outpatient Medications  Medication Sig Dispense Refill   Butalbital-APAP-Caffeine 50-325-40 MG capsule Take 1 capsule by mouth 2 (two) times daily as needed  for migraine.     ibuprofen (ADVIL,MOTRIN) 600 MG tablet Take 1 tablet (600 mg total) by mouth 3 (three) times daily. 90 tablet 1   meloxicam (MOBIC) 15 MG tablet Take 15 mg by mouth daily.     Multiple Vitamin (MULTIVITAMIN) tablet Take 1 tablet by mouth daily.     naproxen sodium (ANAPROX) 220 MG tablet Take 220 mg by mouth at bedtime as needed (for pain.).      silver sulfADIAZINE (SILVADENE) 1 % cream Apply 1 application topically daily. 50 g 0   irbesartan (AVAPRO) 150 MG tablet Take 150 mg by mouth daily. (Patient not taking: Reported on 08/04/2021)     telmisartan (MICARDIS) 40 MG tablet Take 40 mg by mouth daily.     No current facility-administered medications for this visit.    Allergies  Allergen Reactions   Sumatriptan Succinate Nausea Only   Tramadol Nausea Only   Tramadol Hcl     Other reaction(s): rash    Social History   Socioeconomic History   Marital status: Single    Spouse name: Not on file   Number of children: 2   Years of education: Not on file   Highest education level: Not on file  Occupational History   Occupation: Theme park manager  Tobacco Use   Smoking status: Never   Smokeless tobacco: Not on file  Substance and Sexual Activity   Alcohol use: No   Drug use: No  Sexual activity: Not on file  Other Topics Concern   Not on file  Social History Narrative   Not on file   Social Determinants of Health   Financial Resource Strain: Not on file  Food Insecurity: Not on file  Transportation Needs: Not on file  Physical Activity: Not on file  Stress: Not on file  Social Connections: Not on file  Intimate Partner Violence: Not on file    Family History  Problem Relation Age of Onset   Cervical cancer Mother    Lung cancer Mother    Breast cancer Mother    Stroke Mother    Valvular heart disease Mother    Prostate cancer Father    Colon cancer Father    Breast cancer Sister 77       53   Breast cancer Sister 63   Hypertension Other     Prostate cancer Other    Uterine cancer Other     Review of Systems:  As stated in the HPI and otherwise negative.   BP 128/86   Pulse 70   Ht 5\' 6"  (1.676 m)   Wt 186 lb 6.4 oz (84.6 kg)   LMP 10/15/2010   SpO2 98%   BMI 30.09 kg/m   Physical Examination: General: Well developed, well nourished, NAD  HEENT: OP clear, mucus membranes moist  SKIN: warm, dry. No rashes. Neuro: No focal deficits  Musculoskeletal: Muscle strength 5/5 all ext  Psychiatric: Mood and affect normal  Neck: No JVD, no carotid bruits, no thyromegaly, no lymphadenopathy.  Lungs:Clear bilaterally, no wheezes, rhonci, crackles Cardiovascular: Regular rate and rhythm. No murmurs, gallops or rubs. Abdomen:Soft. Bowel sounds present. Non-tender.  Extremities: No lower extremity edema. Pulses are 2 + in the bilateral DP/PT.  EKG:  EKG is ordered today. The ekg ordered today demonstrates sinus  Recent Labs: No results found for requested labs within last 8760 hours.   Lipid Panel No results found for: CHOL, TRIG, HDL, CHOLHDL, VLDL, LDLCALC, LDLDIRECT   Wt Readings from Last 3 Encounters:  08/04/21 186 lb 6.4 oz (84.6 kg)  03/14/16 175 lb (79.4 kg)  04/23/14 175 lb 11.2 oz (79.7 kg)      Assessment and Plan:   1. Palpitations: Will arrange an echo to assess LVEF and exclude structural heart disease. 14 day Zio monitor  Current medicines are reviewed at length with the patient today.  The patient does not have concerns regarding medicines.  The following changes have been made:  no change  Labs/ tests ordered today include:   Orders Placed This Encounter  Procedures   LONG TERM MONITOR (3-14 DAYS)   EKG 12-Lead   ECHOCARDIOGRAM COMPLETE      Disposition:   F/U with me or office APP in 4-8 weeks.    Signed, Lauree Chandler, MD 08/04/2021 10:36 AM    [facility] [place], [place], [place]  [address] Phone: [phone]; Fax: [phone]

## 2021-08-18 ENCOUNTER — Other Ambulatory Visit (HOSPITAL_BASED_OUTPATIENT_CLINIC_OR_DEPARTMENT_OTHER): Payer: No Typology Code available for payment source

## 2021-08-22 ENCOUNTER — Encounter (HOSPITAL_COMMUNITY): Payer: Self-pay | Admitting: Cardiovascular Disease

## 2021-09-15 ENCOUNTER — Other Ambulatory Visit (HOSPITAL_COMMUNITY): Payer: No Typology Code available for payment source

## 2021-09-25 ENCOUNTER — Ambulatory Visit: Payer: No Typology Code available for payment source | Admitting: Cardiovascular Disease

## 2021-11-07 ENCOUNTER — Other Ambulatory Visit: Payer: Self-pay | Admitting: Family Medicine

## 2021-11-07 DIAGNOSIS — Z1231 Encounter for screening mammogram for malignant neoplasm of breast: Secondary | ICD-10-CM

## 2021-11-10 ENCOUNTER — Ambulatory Visit
Admission: RE | Admit: 2021-11-10 | Discharge: 2021-11-10 | Disposition: A | Discharge status: Home / Self Care | Payer: No Typology Code available for payment source | Source: Ambulatory Visit | Attending: Family Medicine | Admitting: Family Medicine

## 2021-11-10 ENCOUNTER — Ambulatory Visit: Payer: No Typology Code available for payment source

## 2021-11-10 DIAGNOSIS — Z1231 Encounter for screening mammogram for malignant neoplasm of breast: Secondary | ICD-10-CM

## 2022-01-15 ENCOUNTER — Ambulatory Visit: Payer: No Typology Code available for payment source | Admitting: Cardiovascular Disease

## 2022-01-15 NOTE — Progress Notes (Deleted)
No chief complaint on file.  History of Present Illness: 56 yo female with history of HTN, GERD and migraine headaches who is here today for cardiac follow up. She was seen as a new patient in December 2022 for the evaluation of palpitations. She described feeling her heart fluttering with heavy beats at times but no dyspnea or chest pain. TSH normal in April 2022. Cardiac monitor in December 2022 with sinus with rare PACs, rare PVCs. She did not complete here echo that we discussed at her first visit here.   She is here today for follow up. The patient denies any chest pain, dyspnea, palpitations, lower extremity edema, orthopnea, PND, dizziness, near syncope or syncope.    Primary Care Physician: Lujean Amel, MD   Past Medical History:  Diagnosis Date   Abdominal pain    Atrophic vaginitis 02/21/2018   Bronchitis, acute 09/11/2006   Chronic frontal sinusitis 09/11/2006   Chronic tension-type headache 02/21/2018   Colon cancer screening 06/17/2008   Overview:  ICD10 Conversion   Constipation 02/21/2018   Dizziness    Family history of malignant neoplasm of gastrointestinal tract 06/17/2008   Foot pain 01/22/2014   Gastroesophageal reflux disease 02/21/2018   Hemorrhoids    Hypercalcemia 02/21/2018   Hypertension    Insomnia 02/21/2018   Migraine    headaches more frequent since surgery    Migraine without aura, not refractory 02/21/2018   Seborrheic keratosis 05/15/2007   Overview:  IMO Load 2016 R1.3   Synovitis of ankle 06/29/2014   Uterine fibroid     Past Surgical History:  Procedure Laterality Date   CESAREAN SECTION     HEMORROIDECTOMY     Dr Alessandra Bevels    TOTAL ABDOMINAL HYSTERECTOMY W/ BILATERAL SALPINGOOPHORECTOMY  2012   VAGINAL PROLAPSE REPAIR  03/28/2011   Procedure: VAGINAL VAULT SUSPENSION;  Surgeon: Princess Bruins;  Location: [facility];  Service: Gynecology;  Laterality: N/A;  Repair of Vaginal Cuff    Current Outpatient Medications  Medication Sig Dispense Refill    Butalbital-APAP-Caffeine 50-325-40 MG capsule Take 1 capsule by mouth 2 (two) times daily as needed for migraine.     ibuprofen (ADVIL,MOTRIN) 600 MG tablet Take 1 tablet (600 mg total) by mouth 3 (three) times daily. 90 tablet 1   irbesartan (AVAPRO) 150 MG tablet Take 150 mg by mouth daily. (Patient not taking: Reported on 08/04/2021)     meloxicam (MOBIC) 15 MG tablet Take 15 mg by mouth daily.     Multiple Vitamin (MULTIVITAMIN) tablet Take 1 tablet by mouth daily.     naproxen sodium (ANAPROX) 220 MG tablet Take 220 mg by mouth at bedtime as needed (for pain.).      silver sulfADIAZINE (SILVADENE) 1 % cream Apply 1 application topically daily. 50 g 0   telmisartan (MICARDIS) 40 MG tablet Take 40 mg by mouth daily.     No current facility-administered medications for this visit.    Allergies  Allergen Reactions   Sumatriptan Succinate Nausea Only   Tramadol Nausea Only   Tramadol Hcl     Other reaction(s): rash    Social History   Socioeconomic History   Marital status: Single    Spouse name: Not on file   Number of children: 2   Years of education: Not on file   Highest education level: Not on file  Occupational History   Occupation: Theme park manager  Tobacco Use   Smoking status: Never   Smokeless tobacco: Not on file  Substance and  Sexual Activity   Alcohol use: No   Drug use: No   Sexual activity: Not on file  Other Topics Concern   Not on file  Social History Narrative   Not on file   Social Determinants of Health   Financial Resource Strain: Not on file  Food Insecurity: Not on file  Transportation Needs: Not on file  Physical Activity: Not on file  Stress: Not on file  Social Connections: Not on file  Intimate Partner Violence: Not on file    Family History  Problem Relation Age of Onset   Cervical cancer Mother    Lung cancer Mother    Breast cancer Mother    Stroke Mother    Valvular heart disease Mother    Prostate cancer Father    Colon  cancer Father    Breast cancer Sister 21       53   Breast cancer Sister 22   Hypertension Other    Prostate cancer Other    Uterine cancer Other     Review of Systems:  As stated in the HPI and otherwise negative.   LMP 10/15/2010   Physical Examination: General: Well developed, well nourished, NAD  HEENT: OP clear, mucus membranes moist  SKIN: warm, dry. No rashes. Neuro: No focal deficits  Musculoskeletal: Muscle strength 5/5 all ext  Psychiatric: Mood and affect normal  Neck: No JVD, no carotid bruits, no thyromegaly, no lymphadenopathy.  Lungs:Clear bilaterally, no wheezes, rhonci, crackles Cardiovascular: Regular rate and rhythm. No murmurs, gallops or rubs. Abdomen:Soft. Bowel sounds present. Non-tender.  Extremities: No lower extremity edema. Pulses are 2 + in the bilateral DP/PT.  EKG:  EKG is *** ordered today. The ekg ordered today demonstrates   Recent Labs: No results found for requested labs within last 8760 hours.   Lipid Panel No results found for: CHOL, TRIG, HDL, CHOLHDL, VLDL, LDLCALC, LDLDIRECT   Wt Readings from Last 3 Encounters:  08/04/21 186 lb 6.4 oz (84.6 kg)  03/14/16 175 lb (79.4 kg)  04/23/14 175 lb 11.2 oz (79.7 kg)      Assessment and Plan:   1. Palpitations/PACs/PVCs: ***  Current medicines are reviewed at length with the patient today.  The patient does not have concerns regarding medicines.  The following changes have been made:  no change  Labs/ tests ordered today include:   No orders of the defined types were placed in this encounter.   Disposition:   F/U with me ***  Signed, Lauree Chandler, MD 01/15/2022 12:12 PM    [facility] [place] [place], [place], [place]  [address] Phone: [phone]; Fax: [phone]

## 2022-12-25 ENCOUNTER — Other Ambulatory Visit: Payer: Self-pay | Admitting: Family Medicine

## 2022-12-25 DIAGNOSIS — Z1231 Encounter for screening mammogram for malignant neoplasm of breast: Secondary | ICD-10-CM

## 2022-12-25 NOTE — Progress Notes (Unsigned)
Cardiology Office Note:    Date:  12/26/2022   ID:  Linda Nelson, DOB [birth year], MRN [id number]  PCP:  Darrow Bussing, MD  [facility] Providers Cardiologist:  None    Referring MD: Darrow Bussing, MD   Chief Complaint:  Chest Pain    Patient Profile:  Hypertension Palpitations  Cardiac Studies & Procedures         MONITORS  LONG TERM MONITOR (3-14 DAYS) 08/25/2021  Narrative Patch Wear Time:  13 days and 8 hours (2022-12-02T10:36:56-0500 to 2022-12-15T18:40:25-0500)  Sinus rhythm with sinus bradycardia and several sinus pauses in the early morning hours Rare premature atrial contractions Rare premature ventricular contractions            History of Present Illness:   Linda Nelson is a 57 y.o. female with the above problem list.  She presents today for evaluation of chest pain. Patient was last seen by [facility]/Dr. Clifton James in 2022 for palpitations. She wore a Zio monitor which noted sinus rhythm with dinus bradycardia and sinus pauses in early morning hours. Rare PACs and PVCs.  Today patient reports that for the past 2 months she's has increased frequency of palpitations/pounding heart sensation. This occurs both at rest and with exertion. Sometimes symptoms occur at night when she lays on her right side and resolve when she switches to her left side. She also notes feeling like she's become more short of breath with exertion but also says that overall exertional tolerance has not changed. She continue tolerate exercise/working out. She is frustrated that she has gained weight in recent months, admits that her diet has not been as good. Patient also reports long-standing hiatal hernia and wonders if her chest discomfort might be related. Does not have exacerbation of symptoms with spicy foods though does report occasional dysphagia. She denies swelling in lower extremities, orthopnea, PND. Patient admits that above symptoms are quite stressful for her given  significant cardiovascular history in her mother.     Past Medical History:  Diagnosis Date   Abdominal pain    Atrophic vaginitis 02/21/2018   Bronchitis, acute 09/11/2006   Chronic frontal sinusitis 09/11/2006   Chronic tension-type headache 02/21/2018   Colon cancer screening 06/17/2008   Overview:  ICD10 Conversion   Constipation 02/21/2018   Dizziness    Family history of malignant neoplasm of gastrointestinal tract 06/17/2008   Foot pain 01/22/2014   Gastroesophageal reflux disease 02/21/2018   Hemorrhoids    Hypercalcemia 02/21/2018   Hypertension    Insomnia 02/21/2018   Migraine    headaches more frequent since surgery    Migraine without aura, not refractory 02/21/2018   Seborrheic keratosis 05/15/2007   Overview:  IMO Load 2016 R1.3   Synovitis of ankle 06/29/2014   Uterine fibroid    Current Medications: Current Meds  Medication Sig   Butalbital-APAP-Caffeine 50-325-40 MG capsule Take 1 capsule by mouth 2 (two) times daily as needed for migraine.   meloxicam (MOBIC) 15 MG tablet Take 15 mg by mouth daily.   Multiple Vitamin (MULTIVITAMIN) tablet Take 1 tablet by mouth daily.   telmisartan (MICARDIS) 80 MG tablet Take 80 mg by mouth daily.    Allergies:   Sumatriptan succinate, Tramadol, and Tramadol hcl   Social History   Occupational History   Occupation: Advertising copywriter  Tobacco Use   Smoking status: Never   Smokeless tobacco: Not on file  Substance and Sexual Activity   Alcohol use: No   Drug use: No  Sexual activity: Not on file    Family Hx: The patient's family history includes Breast cancer in her mother; Breast cancer (age of onset: 51) in her sister; Breast cancer (age of onset: 34) in her sister; Cervical cancer in her mother; Colon cancer in her father; Hypertension in an other family member; Lung cancer in her mother; Prostate cancer in her father and another family member; Stroke in her mother; Uterine cancer in an other family member; Valvular heart  disease in her mother.  Review of Systems  Constitutional: Negative for weight gain and weight loss.  Cardiovascular:  Positive for palpitations. Negative for chest pain, dyspnea on exertion, irregular heartbeat, leg swelling, orthopnea and syncope.       Reports occasional pounding sensation in chest  Respiratory:  Positive for shortness of breath (occasionally with exertion). Negative for cough and snoring.   Neurological:  Negative for dizziness.  All other systems reviewed and are negative.    EKGs/Labs/Other Test Reviewed:    EKG:  EKG is ordered today.  The ekg ordered today demonstrates sinus rhythm. No focal/acute ischemic changes.   Recent Labs: No results found for requested labs within last 365 days.   Recent Lipid Panel No results for input(s): "CHOL", "TRIG", "HDL", "VLDL", "LDLCALC", "LDLDIRECT" in the last 8760 hours.    Risk Assessment/Calculations/Metrics:              Physical Exam:    VS:  BP 120/80   Pulse 75   Ht  (1.676 m)   Wt 192 lb 3.2 oz (87.2 kg)   LMP 10/15/2010   SpO2 99%   BMI 31.02 kg/m     Wt Readings from Last 3 Encounters:  12/26/22 192 lb 3.2 oz (87.2 kg)  08/04/21 186 lb 6.4 oz (84.6 kg)  03/14/16 175 lb (79.4 kg)    Cardiovascular:     PMI at left midclavicular line. Normal rate. Regular rhythm. Normal S1. Normal S2.      Murmurs: There is no murmur.     No gallop.  No click. No rub.  Pulses:    Intact distal pulses.  Edema:    Peripheral edema absent.         ASSESSMENT & PLAN:   Primary hypertension Patient on Telmisartan  QD. BP in clinic today 120/80, repeat 136/86. Patient reports similar readings at home. Discussed goal BP <130/80. Patient wishes to focus on weight loss but will continue to track BP at home and will call us if BP remains elevated. I've also asked her to bring her cuff to next OV so that we can verify accuracy.   Palpitations Patient with non-specific sensation of palpitations, occurring  both with exertion and with rest. No dizziness or pre-syncopal symptoms. Will check 7 day Zio, CBC, BMP.  Atypical chest pain Patient with transient/atypical chest pain, sometimes more of a pounding discomfort and other times more constant. No decrease in exertional tolerance and not always worse with exercise. Often occurs when in a supine position and is relieved by laying in left lateral recumbent position. Overall low suspicion for ischemic etiology. Given patient's hx of hiatal hernia and positional relief, I wonder if there is a component of reflux 2/2 hernia. Discussed trial of PPI and advised patient to follow up with PCP. As above, will check echocardiogram and 7 day Zio. If patient has acceleration of symptoms or significantly elevated LDL, would consider cardiac CTA.  Hyperlipidemia Patient with family hx CAD. Last LDL in 2022 107.  Will repeat fasting lipid panel. Discussed LDL goal <100 with patient. Healthy fat/low saturated fat diet discussed.            Dispo:  No follow-ups on file.   Medication Adjustments/Labs and Tests Ordered: Current medicines are reviewed at length with the patient today.  Concerns regarding medicines are outlined above.  Tests Ordered: Orders Placed This Encounter  Procedures   Basic metabolic panel   Lipid panel   CBC   LONG TERM MONITOR (3-14 DAYS)   EKG 12-Lead   ECHOCARDIOGRAM COMPLETE   Medication Changes: No orders of the defined types were placed in this encounter.  Signed, Perlie Gold, PA-C  12/26/2022 1:21 PM    [facility] [address] [place], [place], [place]  [address] Phone: [phone]; Fax: [phone]

## 2022-12-26 ENCOUNTER — Encounter: Payer: Self-pay | Admitting: Cardiology

## 2022-12-26 ENCOUNTER — Ambulatory Visit (INDEPENDENT_AMBULATORY_CARE_PROVIDER_SITE_OTHER): Payer: No Typology Code available for payment source

## 2022-12-26 ENCOUNTER — Ambulatory Visit: Payer: No Typology Code available for payment source | Attending: Physician Assistant | Admitting: Cardiology

## 2022-12-26 VITALS — BP 120/80 | HR 75 | Ht 66.0 in | Wt 192.2 lb

## 2022-12-26 DIAGNOSIS — I10 Essential (primary) hypertension: Secondary | ICD-10-CM

## 2022-12-26 DIAGNOSIS — R0609 Other forms of dyspnea: Secondary | ICD-10-CM

## 2022-12-26 DIAGNOSIS — E782 Mixed hyperlipidemia: Secondary | ICD-10-CM

## 2022-12-26 DIAGNOSIS — E785 Hyperlipidemia, unspecified: Secondary | ICD-10-CM | POA: Insufficient documentation

## 2022-12-26 DIAGNOSIS — R002 Palpitations: Secondary | ICD-10-CM

## 2022-12-26 DIAGNOSIS — R0789 Other chest pain: Secondary | ICD-10-CM | POA: Diagnosis not present

## 2022-12-26 NOTE — Assessment & Plan Note (Signed)
Patient with family hx CAD. Last LDL in 2022 107. Will repeat fasting lipid panel. Discussed LDL goal <100 with patient. Healthy fat/low saturated fat diet discussed.

## 2022-12-26 NOTE — Progress Notes (Unsigned)
Enrolled patient for a 7 day Zio XT monitor to be mailed to patients home   Dr McAlhany to read 

## 2022-12-26 NOTE — Patient Instructions (Signed)
Medication Instructions:  Your physician recommends that you continue on your current medications as directed. Please refer to the Current Medication list given to you today.  *If you need a refill on your cardiac medications before your next appointment, please call your pharmacy*   Lab Work: WHEN COME FOR ECHO, COME FASTING FOR:  BMET, LIPID, & CBC  If you have labs (blood work) drawn today and your tests are completely normal, you will receive your results only by: MyChart Message (if you have MyChart) OR A paper copy in the mail If you have any lab test that is abnormal or we need to change your treatment, we will call you to review the results.   Testing/Procedures: Your physician has requested that you have an echocardiogram. Echocardiography is a painless test that uses sound waves to create images of your heart. It provides your doctor with information about the size and shape of your heart and how well your heart's chambers and valves are working. This procedure takes approximately one hour. There are no restrictions for this procedure. Please do NOT wear cologne, perfume, aftershave, or lotions (deodorant is allowed). Please arrive 15 minutes prior to your appointment time.    ZIO XT- Long Term Monitor Instructions  Your physician has requested you wear a ZIO patch monitor for 7 days.  This is a single patch monitor. [employer] supplies one patch monitor per enrollment. Additional stickers are not available. Please do not apply patch if you will be having a Nuclear Stress Test,  Echocardiogram, Cardiac CT, MRI, or Chest Xray during the period you would be wearing the  monitor. The patch cannot be worn during these tests. You cannot remove and re-apply the  ZIO XT patch monitor.  Your ZIO patch monitor will be mailed 3 day USPS to your address on file. It may take 3-5 days  to receive your monitor after you have been enrolled.  Once you have received your monitor, please review  the enclosed instructions. Your monitor  has already been registered assigning a specific monitor serial # to you.  Billing and Patient Assistance Program Information  We have supplied [employer] with any of your insurance information on file for billing purposes. [employer] offers a sliding scale Patient Assistance Program for patients that do not have  insurance, or whose insurance does not completely cover the cost of the ZIO monitor.  You must apply for the Patient Assistance Program to qualify for this discounted rate.  To apply, please call [employer] at [phone], select option 4, select option 2, ask to apply for  Patient Assistance Program. Meredeth Ide will ask your household income, and how many people  are in your household. They will quote your out-of-pocket cost based on that information.  [employer] will also be able to set up a 25-month, interest-free payment plan if needed.  Applying the monitor   Shave hair from upper left chest.  Hold abrader disc by orange tab. Rub abrader in 40 strokes over the upper left chest as  indicated in your monitor instructions.  Clean area with 4 enclosed alcohol pads. Let dry.  Apply patch as indicated in monitor instructions. Patch will be placed under collarbone on left  side of chest with arrow pointing upward.  Rub patch adhesive wings for 2 minutes. Remove white label marked "1". Remove the white  label marked "2". Rub patch adhesive wings for 2 additional minutes.  While looking in a mirror, press and release button in center of patch. A small  green light will  flash 3-4 times. This will be your only indicator that the monitor has been turned on.  Do not shower for the first 24 hours. You may shower after the first 24 hours.  Press the button if you feel a symptom. You will hear a small click. Record Date, Time and  Symptom in the Patient Logbook.  When you are ready to remove the patch, follow instructions on the last 2 pages of Patient   Logbook. Stick patch monitor onto the last page of Patient Logbook.  Place Patient Logbook in the blue and white box. Use locking tab on box and tape box closed  securely. The blue and white box has prepaid postage on it. Please place it in the mailbox as  soon as possible. Your physician should have your test results approximately 7 days after the  monitor has been mailed back to [facility].  Call [facility] Customer Care at [phone] if you have questions regarding  your ZIO XT patch monitor. Call them immediately if you see an orange light blinking on your  monitor.  If your monitor falls off in less than 4 days, contact our Monitor department at [phone].  If your monitor becomes loose or falls off after 4 days call [employer] at [phone] for  suggestions on securing your monitor   Follow-Up: At [facility], you and your health needs are our priority.  As part of our continuing mission to provide you with exceptional heart care, we have created designated Provider Care Teams.  These Care Teams include your primary Cardiologist (physician) and Advanced Practice Providers (APPs -  Physician Assistants and Nurse Practitioners) who all work together to provide you with the care you need, when you need it.  We recommend signing up for the patient portal called "MyChart".  Sign up information is provided on this After Visit Summary.  MyChart is used to connect with patients for Virtual Visits (Telemedicine).  Patients are able to view lab/test results, encounter notes, upcoming appointments, etc.  Non-urgent messages can be sent to your provider as well.   To learn more about what you can do with MyChart, go to [email or web address].    Your next appointment:   AFTER ECHO   MAKE SURE TO BRING YOUR BLOOD PRESSURE CUFF WITH YOU TO THIS APPOINTMENT  Provider:   None  or Tereso Newcomer, PA-C         Other Instructions

## 2022-12-26 NOTE — Assessment & Plan Note (Addendum)
Patient with transient/atypical chest pain, sometimes more of a pounding discomfort and other times more constant. No decrease in exertional tolerance and not always worse with exercise. Often occurs when in a supine position and is relieved by laying in left lateral recumbent position. Overall low suspicion for ischemic etiology. Given patient's hx of hiatal hernia and positional relief, I wonder if there is a component of reflux 2/2 hernia. Discussed trial of PPI and advised patient to follow up with PCP. As above, will check echocardiogram and 7 day Zio. If patient has acceleration of symptoms or significantly elevated LDL, would consider cardiac CTA.

## 2022-12-26 NOTE — Assessment & Plan Note (Signed)
Patient on Telmisartan  QD. BP in clinic today 120/80, repeat 136/86. Patient reports similar readings at home. Discussed goal BP <130/80. Patient wishes to focus on weight loss but will continue to track BP at home and will call us if BP remains elevated. I've also asked her to bring her cuff to next OV so that we can verify accuracy.

## 2022-12-26 NOTE — Assessment & Plan Note (Addendum)
Patient with non-specific sensation of palpitations, occurring both with exertion and with rest. No dizziness or pre-syncopal symptoms. Will check 7 day Zio, CBC, BMP.

## 2023-01-01 ENCOUNTER — Ambulatory Visit: Payer: No Typology Code available for payment source

## 2023-01-01 DIAGNOSIS — R0609 Other forms of dyspnea: Secondary | ICD-10-CM | POA: Diagnosis not present

## 2023-01-01 DIAGNOSIS — R002 Palpitations: Secondary | ICD-10-CM

## 2023-01-11 ENCOUNTER — Ambulatory Visit: Payer: No Typology Code available for payment source | Attending: Cardiology

## 2023-01-22 ENCOUNTER — Ambulatory Visit: Payer: No Typology Code available for payment source

## 2023-01-24 ENCOUNTER — Ambulatory Visit: Payer: No Typology Code available for payment source

## 2023-01-24 ENCOUNTER — Ambulatory Visit (HOSPITAL_COMMUNITY): Payer: No Typology Code available for payment source

## 2023-01-30 ENCOUNTER — Ambulatory Visit: Payer: No Typology Code available for payment source | Admitting: Physician Assistant

## 2023-02-18 ENCOUNTER — Ambulatory Visit: Payer: No Typology Code available for payment source | Attending: Cardiology

## 2023-02-18 DIAGNOSIS — R0609 Other forms of dyspnea: Secondary | ICD-10-CM

## 2023-02-18 DIAGNOSIS — R002 Palpitations: Secondary | ICD-10-CM

## 2023-02-18 LAB — CBC

## 2023-02-19 LAB — LIPID PANEL
Chol/HDL Ratio: 4.2 ratio (ref 0.0–4.4)
Cholesterol, Total: 156 mg/dL (ref 100–199)
HDL: 37 mg/dL — ABNORMAL LOW (ref >39)
LDL Chol Calc (NIH): 97 mg/dL (ref 0–99)
Triglycerides: 119 mg/dL (ref 0–149)
VLDL Cholesterol Cal: 22 mg/dL (ref 5–40)

## 2023-02-19 LAB — CBC
Hematocrit: 41.6 % (ref 34.0–46.6)
Hemoglobin: 13.7 g/dL (ref 11.1–15.9)
MCH: 29.5 pg (ref 26.6–33.0)
MCHC: 32.9 g/dL (ref 31.5–35.7)
MCV: 90 fL (ref 79–97)
Platelets: 218 10*3/uL (ref 150–450)
RBC: 4.64 x10E6/uL (ref 3.77–5.28)
RDW: 13.4 % (ref 11.7–15.4)
WBC: 5.6 10*3/uL (ref 3.4–10.8)

## 2023-02-19 LAB — BASIC METABOLIC PANEL
BUN/Creatinine Ratio: 23 (ref 9–23)
BUN: 16 mg/dL (ref 6–24)
CO2: 24 mmol/L (ref 20–29)
Calcium: 9.4 mg/dL (ref 8.7–10.2)
Chloride: 107 mmol/L — ABNORMAL HIGH (ref 96–106)
Creatinine, Ser: 0.69 mg/dL (ref 0.57–1.00)
Glucose: 92 mg/dL (ref 70–99)
Potassium: 4.2 mmol/L (ref 3.5–5.2)
Sodium: 143 mmol/L (ref 134–144)
eGFR: 102 mL/min/{1.73_m2} (ref >59)

## 2023-02-21 ENCOUNTER — Ambulatory Visit (HOSPITAL_COMMUNITY): Payer: No Typology Code available for payment source | Attending: Cardiology

## 2023-02-21 DIAGNOSIS — R002 Palpitations: Secondary | ICD-10-CM | POA: Insufficient documentation

## 2023-02-21 DIAGNOSIS — R0609 Other forms of dyspnea: Secondary | ICD-10-CM | POA: Insufficient documentation

## 2023-02-21 LAB — ECHOCARDIOGRAM COMPLETE
Area-P 1/2: 3.63 cm2
S' Lateral: 2.1 cm

## 2023-03-28 ENCOUNTER — Ambulatory Visit: Payer: No Typology Code available for payment source | Admitting: Cardiology

## 2023-05-20 NOTE — Progress Notes (Deleted)
Cardiology Office Note    Patient Name: Linda Nelson Date of Encounter: 05/20/2023  Primary Care Provider:  Darrow Bussing, MD Primary Cardiologist:  None Primary Electrophysiologist: None   Past Medical History    Past Medical History:  Diagnosis Date   Abdominal pain    Atrophic vaginitis 02/21/2018   Bronchitis, acute 09/11/2006   Chronic frontal sinusitis 09/11/2006   Chronic tension-type headache 02/21/2018   Colon cancer screening 06/17/2008   Overview:  ICD10 Conversion   Constipation 02/21/2018   Dizziness    Family history of malignant neoplasm of gastrointestinal tract 06/17/2008   Foot pain 01/22/2014   Gastroesophageal reflux disease 02/21/2018   Hemorrhoids    Hypercalcemia 02/21/2018   Hypertension    Insomnia 02/21/2018   Migraine    headaches more frequent since surgery    Migraine without aura, not refractory 02/21/2018   Seborrheic keratosis 05/15/2007   Overview:  IMO Load 2016 R1.3   Synovitis of ankle 06/29/2014   Uterine fibroid     History of Present Illness  Linda Nelson is a 57 y.o. female with a PMH of HTN, GERD, hiatal hernia, family history of CAD, palpitations who presents today for follow-up of chest pain and palpitations.  She also endorsed shortness of breath with exertion and 2D echo was completed that showed normal EF of 60-65% with no RWMA and moderate LVH with no valvular abnormalities.  Patient also wore a 7-day ZIO for evaluation of palpitations that showed sinus rhythm with isolated rare PACs and PVCs.  Linda Nelson was seen initially in 2022 by Dr. Clifton James for complaint of palpitations. She wore a 14-day ZIO monitor in 2022 that showed sinus bradycardia in the a.m. with rare PACs/PVCs. She was seen most recently on 12/22/2022 by Perlie Gold, PA for complaint of chest pain and palpitations.She also endorsed shortness of breath with exertion and 2D echo was completed that showed normal EF of 60-65% with no RWMA and moderate LVH with no valvular  abnormalities.  Patient also wore a 7-day ZIO for evaluation of palpitations that showed sinus rhythm with isolated rare PACs and PVCs.   During today's visit the patient reports*** .  Patient denies chest pain, palpitations, dyspnea, PND, orthopnea, nausea, vomiting, dizziness, syncope, edema, weight gain, or early satiety.  ***Notes: -Last ischemic evaluation: -Last echo: -Interim ED visits: Review of Systems  Please see the history of present illness.    All other systems reviewed and are otherwise negative except as noted above.  Physical Exam    Wt Readings from Last 3 Encounters:  12/26/22 192 lb 3.2 oz (87.2 kg)  08/04/21 186 lb 6.4 oz (84.6 kg)  03/14/16 175 lb (79.4 kg)   ZO:XWRUE were no vitals filed for this visit.,There is no height or weight on file to calculate BMI. GEN: Well nourished, well developed in no acute distress Neck: No JVD; No carotid bruits Pulmonary: Clear to auscultation without rales, wheezing or rhonchi  Cardiovascular: Normal rate. Regular rhythm. Normal S1. Normal S2.   Murmurs: There is no murmur.  ABDOMEN: Soft, non-tender, non-distended EXTREMITIES:  No edema; No deformity   EKG/LABS/ Recent Cardiac Studies   ECG personally reviewed by me today - ***  Risk Assessment/Calculations:   {Does this patient have ATRIAL FIBRILLATION?:[phone]}      Lab Results  Component Value Date   WBC 5.6 02/18/2023   HGB 13.7 02/18/2023   HCT 41.6 02/18/2023   MCV 90 02/18/2023   PLT 218 02/18/2023   Lab  Results  Component Value Date   CREATININE 0.69 02/18/2023   BUN 16 02/18/2023   NA 143 02/18/2023   K 4.2 02/18/2023   CL 107 (H) 02/18/2023   CO2 24 02/18/2023   Lab Results  Component Value Date   CHOL 156 02/18/2023   HDL 37 (L) 02/18/2023   LDLCALC 97 02/18/2023   TRIG 119 02/18/2023   CHOLHDL 4.2 02/18/2023    Lab Results  Component Value Date   HGBA1C 5.9 (H) 01/22/2014   Assessment & Plan    1.  Essential  hypertension: -Patient's blood pressure today was***  2.  History of palpitations: -Patient wore 7-day ZIO monitor that was reassuring with sinus rhythm and rare PACs/PVCs -Today patient reports***  3.  Exertional dyspnea:   4.***      Disposition: Follow-up with None or APP in *** months {Are you ordering a CV Procedure (e.g. stress test, cath, DCCV, TEE, etc)?   Press F2        :[id number]}   Signed, Napoleon Form, Leodis Rains, NP 05/20/2023, 2:18 PM [facility]

## 2023-05-22 ENCOUNTER — Ambulatory Visit: Payer: No Typology Code available for payment source | Attending: Physician Assistant | Admitting: Nurse Practitioner

## 2023-05-22 DIAGNOSIS — R002 Palpitations: Secondary | ICD-10-CM

## 2023-05-22 DIAGNOSIS — I10 Essential (primary) hypertension: Secondary | ICD-10-CM

## 2023-05-22 DIAGNOSIS — R0609 Other forms of dyspnea: Secondary | ICD-10-CM

## 2024-06-02 ENCOUNTER — Other Ambulatory Visit: Payer: Self-pay | Admitting: Family Medicine

## 2024-06-02 DIAGNOSIS — Z1231 Encounter for screening mammogram for malignant neoplasm of breast: Secondary | ICD-10-CM

## 2024-06-09 ENCOUNTER — Ambulatory Visit
Admission: RE | Admit: 2024-06-09 | Discharge: 2024-06-09 | Disposition: A | Discharge status: Home / Self Care | Source: Ambulatory Visit | Attending: Family Medicine | Admitting: Family Medicine

## 2024-06-09 DIAGNOSIS — Z1231 Encounter for screening mammogram for malignant neoplasm of breast: Secondary | ICD-10-CM
